# Patient Record
Sex: Male | Born: 1970 | Race: Black or African American | Hispanic: No | Marital: Single | Smoking: Never smoker
Health system: Southern US, Community
[De-identification: ages and names within clinical notes are randomized; demographics above are authoritative.]

## PROBLEM LIST (undated history)

## (undated) DIAGNOSIS — J302 Other seasonal allergic rhinitis: Secondary | ICD-10-CM

## (undated) DIAGNOSIS — J9819 Other pulmonary collapse: Secondary | ICD-10-CM

## (undated) HISTORY — PX: HAND SURGERY: SHX662

## (undated) HISTORY — PX: CHEST TUBE INSERTION: SHX231

## (undated) SURGERY — Surgical Case
Anesthesia: *Unknown

---

## 2015-03-02 ENCOUNTER — Emergency Department (HOSPITAL_BASED_OUTPATIENT_CLINIC_OR_DEPARTMENT_OTHER)
Admission: EM | Admit: 2015-03-02 | Discharge: 2015-03-03 | Disposition: A | Discharge status: Home / Self Care | Payer: BLUE CROSS/BLUE SHIELD | Attending: Emergency Medicine | Admitting: Emergency Medicine

## 2015-03-02 ENCOUNTER — Emergency Department (HOSPITAL_BASED_OUTPATIENT_CLINIC_OR_DEPARTMENT_OTHER): Payer: BLUE CROSS/BLUE SHIELD

## 2015-03-02 ENCOUNTER — Encounter (HOSPITAL_BASED_OUTPATIENT_CLINIC_OR_DEPARTMENT_OTHER): Payer: Self-pay | Admitting: *Deleted

## 2015-03-02 DIAGNOSIS — Z79899 Other long term (current) drug therapy: Secondary | ICD-10-CM | POA: Diagnosis not present

## 2015-03-02 DIAGNOSIS — S6992XA Unspecified injury of left wrist, hand and finger(s), initial encounter: Secondary | ICD-10-CM | POA: Diagnosis present

## 2015-03-02 DIAGNOSIS — X58XXXA Exposure to other specified factors, initial encounter: Secondary | ICD-10-CM | POA: Diagnosis not present

## 2015-03-02 DIAGNOSIS — Y999 Unspecified external cause status: Secondary | ICD-10-CM | POA: Insufficient documentation

## 2015-03-02 DIAGNOSIS — S60222A Contusion of left hand, initial encounter: Secondary | ICD-10-CM | POA: Insufficient documentation

## 2015-03-02 DIAGNOSIS — Y929 Unspecified place or not applicable: Secondary | ICD-10-CM | POA: Insufficient documentation

## 2015-03-02 DIAGNOSIS — Y939 Activity, unspecified: Secondary | ICD-10-CM | POA: Insufficient documentation

## 2015-03-02 NOTE — ED Notes (Signed)
Pt c/o left hand injury x 6 days ago

## 2015-03-03 MED ORDER — IBUPROFEN 800 MG PO TABS
800.0000 mg | ORAL_TABLET | Freq: Once | ORAL | Status: AC
  Administered 2015-03-03: 800 mg via ORAL
  Filled 2015-03-03: qty 1

## 2015-03-03 MED ORDER — ACETAMINOPHEN 325 MG PO TABS
650.0000 mg | ORAL_TABLET | Freq: Once | ORAL | Status: AC
  Administered 2015-03-03: 650 mg via ORAL
  Filled 2015-03-03: qty 2

## 2015-03-03 MED ORDER — IBUPROFEN 800 MG PO TABS
800.0000 mg | ORAL_TABLET | Freq: Three times a day (TID) | ORAL | Status: DC
Start: 2015-03-03 — End: 2019-05-14

## 2015-03-03 NOTE — ED Notes (Signed)
PA at the bedside.

## 2015-03-03 NOTE — ED Provider Notes (Signed)
CSN: [id number]     Arrival date & time 03/02/15  2145 History   First MD Initiated Contact with Patient 03/02/15 2317     Chief Complaint  Patient presents with  . Hand Injury     (Consider location/radiation/quality/duration/timing/severity/associated sxs/prior Treatment) Patient is a 44 y.o. male presenting with hand injury. The history is provided by the patient.  Hand Injury Location:  Hand Time since incident:  6 days Injury: yes   Mechanism of injury comment:  Hand mashed in a door Hand location:  L hand Pain details:    Quality:  Aching   Radiates to:  Does not radiate   Severity:  Moderate   Onset quality:  Sudden   Duration:  6 days   Timing:  Intermittent   Progression:  Worsening Chronicity:  New Handedness:  Right-handed Dislocation: no   Relieved by:  Nothing Worsened by:  Movement Ineffective treatments:  Ice Associated symptoms: decreased range of motion   Associated symptoms: no back pain, no neck pain and no numbness   Risk factors: no frequent fractures     History reviewed. No pertinent past medical history. History reviewed. No pertinent past surgical history. History reviewed. No pertinent family history. History  Substance Use Topics  . Smoking status: Never Smoker   . Smokeless tobacco: Not on file  . Alcohol Use: No    Review of Systems  Constitutional: Negative for activity change.       All ROS Neg except as noted in HPI  HENT: Negative for nosebleeds.   Eyes: Negative for photophobia and discharge.  Respiratory: Negative for cough, shortness of breath and wheezing.   Cardiovascular: Negative for chest pain and palpitations.  Gastrointestinal: Negative for abdominal pain and blood in stool.  Genitourinary: Negative for dysuria, frequency and hematuria.  Musculoskeletal: Negative for back pain, arthralgias and neck pain.  Skin: Negative.   Neurological: Negative for dizziness, seizures and speech difficulty.  Psychiatric/Behavioral:  Negative for hallucinations and confusion.      Allergies  Tramadol  Home Medications   Prior to Admission medications   Medication Sig Start Date End Date Taking? Authorizing Provider  montelukast (SINGULAIR) 10 MG tablet Take 10 mg by mouth at bedtime.   Yes Historical Provider, MD   BP 134/84 mmHg  Pulse 83  Temp(Src) 98.1 F (36.7 C)  Resp 18  Ht 6' 5.25" (1.962 m)  Wt 175 lb (79.379 kg)  BMI 20.62 kg/m2  SpO2 97% Physical Exam  Musculoskeletal:       Left shoulder: Normal.       Left elbow: Normal.       Left hand: He exhibits decreased range of motion and tenderness. He exhibits normal capillary refill and no deformity. Normal sensation noted.       Hands:   ED Course  Procedures (including critical care time) Labs Review Labs Reviewed - No data to display  Imaging Review Dg Hand Complete Left  03/02/2015   CLINICAL DATA:  Hand was slammed in a car door 1 week ago. Pain over the second through fourth knuckles.  EXAM: LEFT HAND - COMPLETE 3+ VIEW  COMPARISON:  None.  FINDINGS: There is no evidence of fracture or dislocation. There is no evidence of arthropathy or other focal bone abnormality. Soft tissues are unremarkable.  IMPRESSION: Negative.   Electronically Signed   By: Burman Nieves M.D.   On: 03/02/2015 22:09     EKG Interpretation None      MDM  Your  vital signs are well within normal limits. The x-ray of the left hand is negative for fracture or dislocation. The plan at this time will be ice, Ace bandage, ibuprofen and Tylenol. The patient states he [facility]. He is asked to see them for additional follow-up and management. The patient is in agreement with this discharge plan.    Final diagnoses:  None    *I have reviewed nursing notes, vital signs, and all appropriate lab and imaging results for this patient.**  I'll or  Ivery QualeHobson Kweku Stankey, Cordelia Poche-C 03/03/15 [id number]  April Palumbo,  MD 03/03/15 [id number]

## 2015-03-03 NOTE — Discharge Instructions (Signed)
Your x-ray is negative for fracture or dislocation. Please see your orthopedic specialist at [facility], for additional evaluation of your hand pain. Please use the Ace bandage for the next 3 days. Please use 800 mg of ibuprofen, and 500 mg of Tylenol 3 times daily over the next 5 days. Hand Contusion  A hand contusion is a deep bruise to the hand. Contusions happen when an injury causes bleeding under the skin. Signs of bruising include pain, puffiness (swelling), and discolored skin. The contusion may turn blue, purple, or yellow. HOME CARE  Put ice on the injured area.  Put ice in a plastic bag.  Place a towel between your skin and the bag.  Leave the ice on for 15-20 minutes, 03-04 times a day.  Only take medicines as told by your doctor.  Use an elastic wrap only as told. You may remove the wrap for sleeping, showering, and bathing. Take the wrap off if you lose feeling (have numbness) in your fingers, or they turn blue or cold. Put the wrap on more loosely.  Keep the hand raised (elevated) with pillows.  Avoid using your hand too much if it is painful. GET HELP RIGHT AWAY IF:   You have more redness, puffiness, or pain in your hand.  Your puffiness or pain does not get better with medicine.  You lose feeling in your hand, or you cannot move your fingers.  Your hand turns cold or blue.  You have pain when you move your fingers.  Your hand feels warm.  Your contusion does not get better in 2 days. MAKE SURE YOU:   Understand these instructions.  Will watch this condition.  Will get help right away if you are not doing well or you get worse. Document Released: 01/25/2008 Document Revised: 12/23/2013 Document Reviewed: 01/30/2012 [facility] Patient Information 2015 [employer] [place] [place]. This information is not intended to replace advice given to you by your health care provider. Make sure you discuss any questions you have with your health care  provider.

## 2019-05-14 ENCOUNTER — Emergency Department (HOSPITAL_BASED_OUTPATIENT_CLINIC_OR_DEPARTMENT_OTHER): Payer: Managed Care, Other (non HMO)

## 2019-05-14 ENCOUNTER — Encounter (HOSPITAL_COMMUNITY)
Admission: EM | Disposition: A | Discharge status: Home / Self Care | Payer: Self-pay | Source: Home / Self Care | Attending: Cardiothoracic Surgery

## 2019-05-14 ENCOUNTER — Other Ambulatory Visit: Payer: Self-pay

## 2019-05-14 ENCOUNTER — Encounter (HOSPITAL_BASED_OUTPATIENT_CLINIC_OR_DEPARTMENT_OTHER): Payer: Self-pay | Admitting: Emergency Medicine

## 2019-05-14 ENCOUNTER — Encounter (HOSPITAL_COMMUNITY): Payer: Self-pay | Admitting: Certified Registered Nurse Anesthetist

## 2019-05-14 ENCOUNTER — Inpatient Hospital Stay (HOSPITAL_BASED_OUTPATIENT_CLINIC_OR_DEPARTMENT_OTHER)
Admission: EM | Admit: 2019-05-14 | Discharge: 2019-05-18 | DRG: 200 | Disposition: A | Discharge status: Home / Self Care | Payer: Managed Care, Other (non HMO) | Attending: Cardiothoracic Surgery | Admitting: Cardiothoracic Surgery

## 2019-05-14 ENCOUNTER — Inpatient Hospital Stay: Admit: 2019-05-14 | Payer: BLUE CROSS/BLUE SHIELD | Admitting: Cardiothoracic Surgery

## 2019-05-14 ENCOUNTER — Inpatient Hospital Stay (HOSPITAL_COMMUNITY): Payer: Managed Care, Other (non HMO)

## 2019-05-14 DIAGNOSIS — F17210 Nicotine dependence, cigarettes, uncomplicated: Secondary | ICD-10-CM | POA: Diagnosis present

## 2019-05-14 DIAGNOSIS — Z9889 Other specified postprocedural states: Secondary | ICD-10-CM

## 2019-05-14 DIAGNOSIS — Z09 Encounter for follow-up examination after completed treatment for conditions other than malignant neoplasm: Secondary | ICD-10-CM

## 2019-05-14 DIAGNOSIS — Z885 Allergy status to narcotic agent status: Secondary | ICD-10-CM

## 2019-05-14 DIAGNOSIS — J849 Interstitial pulmonary disease, unspecified: Secondary | ICD-10-CM | POA: Diagnosis present

## 2019-05-14 DIAGNOSIS — J302 Other seasonal allergic rhinitis: Secondary | ICD-10-CM | POA: Diagnosis present

## 2019-05-14 DIAGNOSIS — R918 Other nonspecific abnormal finding of lung field: Secondary | ICD-10-CM | POA: Diagnosis not present

## 2019-05-14 DIAGNOSIS — J939 Pneumothorax, unspecified: Secondary | ICD-10-CM | POA: Diagnosis present

## 2019-05-14 DIAGNOSIS — Z9689 Presence of other specified functional implants: Secondary | ICD-10-CM

## 2019-05-14 DIAGNOSIS — Z79899 Other long term (current) drug therapy: Secondary | ICD-10-CM | POA: Diagnosis not present

## 2019-05-14 DIAGNOSIS — J930 Spontaneous tension pneumothorax: Principal | ICD-10-CM | POA: Diagnosis present

## 2019-05-14 DIAGNOSIS — F1210 Cannabis abuse, uncomplicated: Secondary | ICD-10-CM | POA: Diagnosis present

## 2019-05-14 DIAGNOSIS — J9383 Other pneumothorax: Secondary | ICD-10-CM | POA: Diagnosis not present

## 2019-05-14 DIAGNOSIS — J984 Other disorders of lung: Secondary | ICD-10-CM | POA: Diagnosis not present

## 2019-05-14 DIAGNOSIS — J9311 Primary spontaneous pneumothorax: Secondary | ICD-10-CM | POA: Diagnosis not present

## 2019-05-14 DIAGNOSIS — Z20828 Contact with and (suspected) exposure to other viral communicable diseases: Secondary | ICD-10-CM | POA: Diagnosis present

## 2019-05-14 DIAGNOSIS — J189 Pneumonia, unspecified organism: Secondary | ICD-10-CM | POA: Diagnosis not present

## 2019-05-14 HISTORY — DX: Other seasonal allergic rhinitis: J30.2

## 2019-05-14 HISTORY — DX: Other pulmonary collapse: J98.19

## 2019-05-14 LAB — COMPREHENSIVE METABOLIC PANEL
ALT: 43 U/L (ref 0–44)
AST: 65 U/L — ABNORMAL HIGH (ref 15–41)
Albumin: 3.3 g/dL — ABNORMAL LOW (ref 3.5–5.0)
Alkaline Phosphatase: 77 U/L (ref 38–126)
Anion gap: 13 (ref 5–15)
BUN: 7 mg/dL (ref 6–20)
CO2: 25 mmol/L (ref 22–32)
Calcium: 9 mg/dL (ref 8.9–10.3)
Chloride: 96 mmol/L — ABNORMAL LOW (ref 98–111)
Creatinine, Ser: 0.89 mg/dL (ref 0.61–1.24)
GFR calc Af Amer: >60 mL/min (ref >60)
GFR calc non Af Amer: >60 mL/min (ref >60)
Glucose, Bld: 119 mg/dL — ABNORMAL HIGH (ref 70–99)
Potassium: 4.1 mmol/L (ref 3.5–5.1)
Sodium: 134 mmol/L — ABNORMAL LOW (ref 135–145)
Total Bilirubin: 1 mg/dL (ref 0.3–1.2)
Total Protein: 8.3 g/dL — ABNORMAL HIGH (ref 6.5–8.1)

## 2019-05-14 LAB — CBC WITH DIFFERENTIAL/PLATELET
Abs Immature Granulocytes: 0.12 10*3/uL — ABNORMAL HIGH (ref 0.00–0.07)
Basophils Absolute: 0.1 10*3/uL (ref 0.0–0.1)
Basophils Relative: 0 %
Eosinophils Absolute: 0.1 10*3/uL (ref 0.0–0.5)
Eosinophils Relative: 0 %
HCT: 41.7 % (ref 39.0–52.0)
Hemoglobin: 14 g/dL (ref 13.0–17.0)
Immature Granulocytes: 1 %
Lymphocytes Relative: 11 %
Lymphs Abs: 1.9 10*3/uL (ref 0.7–4.0)
MCH: 31 pg (ref 26.0–34.0)
MCHC: 33.6 g/dL (ref 30.0–36.0)
MCV: 92.3 fL (ref 80.0–100.0)
Monocytes Absolute: 2 10*3/uL — ABNORMAL HIGH (ref 0.1–1.0)
Monocytes Relative: 11 %
Neutro Abs: 13.8 10*3/uL — ABNORMAL HIGH (ref 1.7–7.7)
Neutrophils Relative %: 77 %
Platelets: 333 10*3/uL (ref 150–400)
RBC: 4.52 MIL/uL (ref 4.22–5.81)
RDW: 12.3 % (ref 11.5–15.5)
WBC: 18 10*3/uL — ABNORMAL HIGH (ref 4.0–10.5)
nRBC: 0 % (ref 0.0–0.2)

## 2019-05-14 LAB — LACTIC ACID, PLASMA
Lactic Acid, Venous: 1.8 mmol/L (ref 0.5–1.9)
Lactic Acid, Venous: 1.3 mmol/L (ref 0.5–1.9)

## 2019-05-14 LAB — SARS CORONAVIRUS 2 BY RT PCR (HOSPITAL ORDER, PERFORMED IN ~~LOC~~ HOSPITAL LAB): SARS Coronavirus 2: NEGATIVE

## 2019-05-14 SURGERY — CHEST TUBE INSERTION
Anesthesia: Monitor Anesthesia Care | Laterality: Right

## 2019-05-14 MED ORDER — OXYCODONE HCL 5 MG PO TABS
5.0000 mg | ORAL_TABLET | ORAL | Status: DC | PRN
  Administered 2019-05-14 – 2019-05-18 (×13): 5 mg via ORAL
  Filled 2019-05-14 (×13): qty 1

## 2019-05-14 MED ORDER — FENTANYL CITRATE (PF) 250 MCG/5ML IJ SOLN
INTRAMUSCULAR | Status: AC
  Filled 2019-05-14: qty 5

## 2019-05-14 MED ORDER — MONTELUKAST SODIUM 10 MG PO TABS
10.0000 mg | ORAL_TABLET | Freq: Every day | ORAL | Status: DC
  Administered 2019-05-14 – 2019-05-17 (×4): 10 mg via ORAL
  Filled 2019-05-14 (×4): qty 1

## 2019-05-14 MED ORDER — ONDANSETRON HCL 4 MG PO TABS: 4.0000 mg | ORAL_TABLET | Freq: Four times a day (QID) | ORAL | Status: DC | PRN

## 2019-05-14 MED ORDER — DOCUSATE SODIUM 100 MG PO CAPS: 100.0000 mg | ORAL_CAPSULE | Freq: Two times a day (BID) | ORAL | Status: DC | PRN

## 2019-05-14 MED ORDER — MIDAZOLAM HCL 2 MG/2ML IJ SOLN
INTRAMUSCULAR | Status: AC
  Filled 2019-05-14: qty 2

## 2019-05-14 MED ORDER — FENTANYL CITRATE (PF) 100 MCG/2ML IJ SOLN
50.0000 ug | Freq: Once | INTRAMUSCULAR | Status: AC
  Administered 2019-05-14: 17:00:00 50 ug via INTRAVENOUS
  Filled 2019-05-14: qty 2

## 2019-05-14 MED ORDER — ONDANSETRON HCL 4 MG/2ML IJ SOLN
4.0000 mg | Freq: Once | INTRAMUSCULAR | Status: AC
  Administered 2019-05-14: 17:00:00 4 mg via INTRAVENOUS
  Filled 2019-05-14: qty 2

## 2019-05-14 MED ORDER — METOPROLOL TARTRATE 5 MG/5ML IV SOLN: 2.5000 mg | INTRAVENOUS | Status: DC | PRN

## 2019-05-14 MED ORDER — PROPOFOL 10 MG/ML IV BOLUS
INTRAVENOUS | Status: AC
  Filled 2019-05-14: qty 20

## 2019-05-14 MED ORDER — SODIUM CHLORIDE 0.9% FLUSH
3.0000 mL | Freq: Two times a day (BID) | INTRAVENOUS | Status: DC
  Administered 2019-05-14 – 2019-05-17 (×6): 3 mL via INTRAVENOUS

## 2019-05-14 MED ORDER — ONDANSETRON HCL 4 MG/2ML IJ SOLN: 4.0000 mg | Freq: Four times a day (QID) | INTRAMUSCULAR | Status: DC | PRN

## 2019-05-14 MED ORDER — ACETAMINOPHEN 325 MG PO TABS: 650.0000 mg | ORAL_TABLET | Freq: Four times a day (QID) | ORAL | Status: DC | PRN

## 2019-05-14 MED ORDER — ENOXAPARIN SODIUM 40 MG/0.4ML ~~LOC~~ SOLN
40.0000 mg | SUBCUTANEOUS | Status: DC
  Administered 2019-05-15 – 2019-05-18 (×4): 40 mg via SUBCUTANEOUS
  Filled 2019-05-14 (×5): qty 0.4

## 2019-05-14 NOTE — ED Triage Notes (Signed)
SOB, cough, diarrhea, minimal vomiting, inability to eat, fatigue x3 days.

## 2019-05-14 NOTE — ED Notes (Signed)
ED Provider at bedside. 

## 2019-05-14 NOTE — ED Notes (Signed)
Meals given for lunch.  Tolerated it well.

## 2019-05-14 NOTE — H&P (Addendum)
[address]       [place] [address]             [phone]        Blake Benson [facility] Medical Record #[id number] Date of Birth: Sep 05, 1970  Referring: [facility] ED Primary Care: Darryl Lent, PA-C  Chief Complaint:    Chief Complaint  Patient presents with  . Shortness of Breath    History of Present Illness:      Mr. Fingerhut is a 48 yo AA male transferred to [facility] emergency department for right pneumothorax.  The patient presented to [facility] ED with 3 day complaint of feeling ill with chest pain, shortness of breath, nausea, vomiting, diarrhea, and night sweats.  Workup in the ED was negative for COVID-19.  CXR was obtained and showed a right sided pneumothorax with mediastinal shift.  He was transferred to [facility] for intervention.  Upon arrival to the ED, the physician reviewed his chest xray and placed a right sided pigtail chest tube.  The patient tolerated the procedure without difficulty.  Currently the patient is sitting up in bed without complaints.  He states today was the first day he was able to eat any food.  He admits to smoking cigarettes which he states his usage has decreased, but he also smokes marijuana daily.  He drinks ETOH mainly on the weekends.  He has a previous history of pneumothorax requiring chest tube placement performed at [facility] in the past.  He has had 1 previous surgery on his left wrist and otherwise denies medical problems.  Current Activity/ Functional Status: Patient is independent with mobility/ambulation, transfers, ADL's, IADL's.  Past Medical History:  Diagnosis Date  . Collapsed lung   . Seasonal allergies     Past Surgical History:  Procedure Laterality Date  . CHEST TUBE INSERTION    . HAND SURGERY      Social History   Tobacco Use  Smoking Status Never Smoker  Smokeless Tobacco Never Used    Social History   Substance and Sexual Activity  Alcohol Use No      Allergies  Allergen Reactions  . Tramadol     Current Facility-Administered Medications  Medication Dose Route Frequency Provider Last Rate Last Dose  . acetaminophen (TYLENOL) tablet 650 mg  650 mg Oral Q6H PRN Barrett, Erin R, PA-C      . docusate sodium (COLACE) capsule 100 mg  100 mg Oral BID PRN Barrett, Erin R, PA-C      . [START ON 05/15/2019] enoxaparin (LOVENOX) injection 40 mg  40 mg Subcutaneous Q24H Barrett, Erin R, PA-C      . metoprolol tartrate (LOPRESSOR) injection 2.5-5 mg  2.5-5 mg Intravenous Q5 min PRN Barrett, Erin R, PA-C      . montelukast (SINGULAIR) tablet 10 mg  10 mg Oral QHS Barrett, Erin R, PA-C      . ondansetron (ZOFRAN) tablet 4 mg  4 mg Oral Q6H PRN Barrett, Erin R, PA-C       Or  . ondansetron (ZOFRAN) injection 4 mg  4 mg Intravenous Q6H PRN Barrett, Erin R, PA-C      . oxyCODONE (Oxy IR/ROXICODONE) immediate release tablet 5 mg  5 mg Oral Q4H PRN Barrett, Erin R, PA-C      . sodium chloride flush (NS) 0.9 % injection 3 mL  3 mL Intravenous Q12H Barrett, Erin R, PA-C       Current  Outpatient Medications  Medication Sig Dispense Refill  . ibuprofen (ADVIL,MOTRIN) 800 MG tablet Take 1 tablet (800 mg total) by mouth 3 (three) times daily. 21 tablet 0  . montelukast (SINGULAIR) 10 MG tablet Take 10 mg by mouth at bedtime.     Review of Systems:   Review of Systems  Neurological: Sensory change:     Constitutional: positive for night sweats Respiratory: positive for cough and dyspnea on exertion Cardiovascular: positive for chest pain Gastrointestinal: positive for diarrhea, nausea and vomiting Neurological: negative     Cardiac Review of Systems: Y or  [    ]= no  Chest Pain [ Y, resolved   ]  Resting SOB [   ] Exertional SOB  [ Y]  Orthopnea [  ]   Pedal Edema [   ]    Palpitations [  ] Syncope  [  ]   Presyncope [   ]  General Review of Systems: [Y] = yes [  ]=no Constitional: recent weight change [  ]; anorexia [  ]; fatigue [  ]; nausea [Y,  improved  ]; night sweats [Y  ]; fever [ N ]; or chills [  ]                                                               Dental: Last Dentist visit:   Eye : blurred vision [  ]; diplopia [   ]; vision changes [  ];  Amaurosis fugax[  ]; Resp: cough [ Y ];  wheezing[  ];  hemoptysis[N  ]; shortness of breath[  ]; paroxysmal nocturnal dyspnea[  ]; dyspnea on exertion[  ]; or orthopnea[  ];  GI:  gallstones[  ], vomiting[Y, resolved  ];  dysphagia[  ]; melena[  ];  hematochezia [  ]; heartburn[  ];   Hx of  Colonoscopy[  ]; GU: kidney stones [  ]; hematuria[  ];   dysuria [  ];  nocturia[  ];  history of     obstruction [  ]; urinary frequency [  ]             Skin: rash, swelling[N  ];, hair loss[  ];  peripheral edema[  ];  or itching[  ]; Musculosketetal: myalgias[  ];  joint swelling[  ];  joint erythema[  ];  joint pain[  ];  back pain[  ];  Heme/Lymph: bruising[  ];  bleeding[  ];  anemia[  ];  Neuro: TIA[  ];  headaches[  ];  stroke[  ];  vertigo[  ];  seizures[  ];   paresthesias[  ];  difficulty walking[  ];  Psych:depression[  ]; anxiety[  ];  Endocrine: diabetes[  ];  thyroid dysfunction[  ];  Physical Exam: BP (!) 145/93   Pulse 87   Temp 98.8 F (37.1 C) (Oral)   Resp (!) 38   Ht 6\' 5"  (1.956 m)   Wt 77.2 kg   SpO2 92%   BMI 20.18 kg/m    General appearance: alert, cooperative and no distress Head: Normocephalic, without obvious abnormality, atraumatic Resp: clear to auscultation bilaterally Cardio: regular rate and rhythm GI: soft, non-tender; bowel sounds normal; no masses,  no organomegaly Extremities: extremities normal, atraumatic, no cyanosis or  edema Neurologic: Grossly normal  Diagnostic Studies & Laboratory data:     Recent Radiology Findings:   Dg Chest Port 1 View  Result Date: 05/14/2019 CLINICAL DATA:  Shortness of breath EXAM: PORTABLE CHEST 1 VIEW COMPARISON:  None. FINDINGS: There is a moderate to large right-sided pneumothorax with leftward  mediastinal shift. There is hazy airspace opacity seen throughout the left lung. No acute osseous findings. IMPRESSION: Moderate to large right-sided pneumothorax. These results were called by telephone at the time of interpretation on 05/14/2019 at 9:34 am to provider [facility] , who verbally acknowledged these results. Electronically Signed   By: Prudencio Pair M.D.   On: 05/14/2019 09:39     I have independently reviewed the above radiologic studies and discussed with the patient   Recent Lab Findings: Lab Results  Component Value Date   WBC 18.0 (H) 05/14/2019   HGB 14.0 05/14/2019   HCT 41.7 05/14/2019   PLT 333 05/14/2019   GLUCOSE 119 (H) 05/14/2019   ALT 43 05/14/2019   AST 65 (H) 05/14/2019   NA 134 (L) 05/14/2019   K 4.1 05/14/2019   CL 96 (L) 05/14/2019   CREATININE 0.89 05/14/2019   BUN 7 05/14/2019   CO2 25 05/14/2019    Assessment / Plan:      1. Right sided pneumothorax with mediastinal shift- ED physician placed pigtail catheter- no air leak in pleurovac, leave chest tube on suction.. follow up CXR remains pending 2. Nicotine abuse 3. Marijuana abuse 4. Dispo- admit patient to progressive care unit, being this is recurrent pneumothorax will get CT chest in AM.. if pneumothorax doesn't completely resolve may require VATS procedure in future  I  spent 55 minutes counseling the patient face to face.   Ellwood Handler, PA-C  05/14/2019 5:32 PM Patient examined, chest x-rays both before and after right pigtail catheter placement personally reviewed. Patient discussed with Ms. Ahmed Prima PA-C for coordination of care. 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  Chest x-ray at the urgent care center showed a significant right spontaneous pneumothorax.  A pleural pigtail catheter placed in the ED shows good reexpansion of the right lung.  The patient is currently stable.  He has  history of a previous right sided spontaneous pneumothorax treated with chest tube at [facility] 2 years ago.  Will evaluate him with a CT scan of the chest with IV contrast to assess his risk of further recurrent spontaneous right pneumothorax.  If his risk is felt to be significant then he may need a VATS procedure later this admission. Dahlia Byes MD

## 2019-05-14 NOTE — ED Provider Notes (Signed)
[facility] EMERGENCY DEPARTMENT Provider Note   CSN: [id number] Arrival date & time: 05/14/19  5638     History   Chief Complaint Chief Complaint  Patient presents with  . Shortness of Breath    HPI Blake Benson is a 48 y.o. male.  He is complaining of feeling sick since Friday 3 days ago.  He is complaining of shortness of breath with ambulation, cough nonproductive, chest pain in the setting of coughing, nausea and diarrhea with decreased appetite.  No known fever but has woken up at night sweaty.  No sick contacts or recent travel.  No known Covid exposures.  Complaining of fatigue but no headache or body aches.     The history is provided by the patient.  Shortness of Breath Severity:  Moderate Onset quality:  Gradual Timing:  Intermittent Progression:  Unchanged Chronicity:  New Context: activity   Relieved by:  Rest Worsened by:  Activity Ineffective treatments:  None tried Associated symptoms: chest pain and cough   Associated symptoms: no abdominal pain, no fever, no headaches, no hemoptysis, no neck pain, no rash, no sore throat, no sputum production, no syncope, no vomiting and no wheezing   Risk factors: no tobacco use     Past Medical History:  Diagnosis Date  . Collapsed lung   . Seasonal allergies     There are no active problems to display for this patient.   Past Surgical History:  Procedure Laterality Date  . CHEST TUBE INSERTION    . HAND SURGERY          Home Medications    Prior to Admission medications   Medication Sig Start Date End Date Taking? Authorizing Provider  ibuprofen (ADVIL,MOTRIN) 800 MG tablet Take 1 tablet (800 mg total) by mouth 3 (three) times daily. 03/03/15   Lily Kocher, PA-C  montelukast (SINGULAIR) 10 MG tablet Take 10 mg by mouth at bedtime.    [provider]    Family History No family history on file.  Social History Social History   Tobacco Use  . Smoking status: Never Smoker  .  Smokeless tobacco: Never Used  Substance Use Topics  . Alcohol use: No  . Drug use: Not Currently     Allergies   Tramadol   Review of Systems Review of Systems  Constitutional: Positive for appetite change and fatigue. Negative for fever.  HENT: Negative for sore throat.   Eyes: Negative for visual disturbance.  Respiratory: Positive for cough and shortness of breath. Negative for hemoptysis, sputum production and wheezing.   Cardiovascular: Positive for chest pain. Negative for syncope.  Gastrointestinal: Positive for diarrhea and nausea. Negative for abdominal pain and vomiting.  Genitourinary: Negative for dysuria.  Musculoskeletal: Negative for myalgias and neck pain.  Skin: Negative for rash.  Neurological: Negative for headaches.     Physical Exam Updated Vital Signs BP (!) 140/98 (BP Location: Right Arm)   Pulse 94   Temp 98.8 F (37.1 C) (Oral)   Resp (!) 32   Ht 6\' 5"  (1.956 m)   Wt 77.2 kg   SpO2 92%   BMI 20.18 kg/m   Physical Exam Vitals signs and nursing note reviewed.  Constitutional:      Appearance: He is well-developed.  HENT:     Head: Normocephalic and atraumatic.  Eyes:     Conjunctiva/sclera: Conjunctivae normal.  Neck:     Musculoskeletal: Neck supple.  Cardiovascular:     Rate and Rhythm: Normal rate and  regular rhythm.     Heart sounds: No murmur.  Pulmonary:     Effort: Pulmonary effort is normal. Tachypnea present. No respiratory distress.     Breath sounds: Examination of the right-upper field reveals rhonchi. Examination of the left-upper field reveals rhonchi. Examination of the right-lower field reveals decreased breath sounds. Decreased breath sounds and rhonchi present.  Abdominal:     Palpations: Abdomen is soft.     Tenderness: There is no abdominal tenderness.  Musculoskeletal: Normal range of motion.        General: No tenderness.     Right lower leg: He exhibits no tenderness. No edema.     Left lower leg: He exhibits  no tenderness. No edema.  Skin:    General: Skin is warm and dry.     Capillary Refill: Capillary refill takes less than 2 seconds.  Neurological:     General: No focal deficit present.     Mental Status: He is alert and oriented to person, place, and time.      ED Treatments / Results  Labs (all labs ordered are listed, but only abnormal results are displayed) Labs Reviewed  COMPREHENSIVE METABOLIC PANEL - Abnormal; Notable for the following components:      Result Value   Sodium 134 (*)    Chloride 96 (*)    Glucose, Bld 119 (*)    Total Protein 8.3 (*)    Albumin 3.3 (*)    AST 65 (*)    All other components within normal limits  CBC WITH DIFFERENTIAL/PLATELET - Abnormal; Notable for the following components:   WBC 18.0 (*)    Neutro Abs 13.8 (*)    Monocytes Absolute 2.0 (*)    Abs Immature Granulocytes 0.12 (*)    All other components within normal limits  SARS CORONAVIRUS 2 (HOSPITAL ORDER, PERFORMED IN [facility])  LACTIC ACID, PLASMA  LACTIC ACID, PLASMA    EKG EKG Interpretation  Date/Time:  Tuesday May 14 2019 09:16:17 EDT Ventricular Rate:  89 PR Interval:    QRS Duration: 144 QT Interval:  343 QTC Calculation: 418 R Axis:   96 Text Interpretation:  Sinus rhythm Atrial premature complexes Borderline short PR interval Nonspecific intraventricular conduction delay nonspecific st/ts No old tracing to compare Confirmed by Meridee ScoreButler, Mariamawit Depaoli [phone]([id number]) on 05/14/2019 9:23:25 AM   Radiology Dg Chest Port 1 View  Result Date: 05/14/2019 CLINICAL DATA:  Shortness of breath EXAM: PORTABLE CHEST 1 VIEW COMPARISON:  None. FINDINGS: There is a moderate to large right-sided pneumothorax with leftward mediastinal shift. There is hazy airspace opacity seen throughout the left lung. No acute osseous findings. IMPRESSION: Moderate to large right-sided pneumothorax. These results were called by telephone at the time of interpretation on 05/14/2019 at 9:34 am to  [facility] Max[facility] Beck[facility] , who verbally acknowledged these results. Electronically Signed   By: Jonna ClarkBindu  Avutu M.D.   On: 05/14/2019 09:39    Procedures .Critical Care Performed by: Terrilee FilesButler, Halea Lieb C, MD Authorized by: Terrilee FilesButler, Melvenia Favela C, MD   Critical care provider statement:    Critical care time (minutes):  45   Critical care time was exclusive of:  Separately billable procedures and treating other patients   Critical care was necessary to treat or prevent imminent or life-threatening deterioration of the following conditions:  Respiratory failure   Critical care was time spent personally by me on the following activities:  Discussions with consultants, evaluation of patient's response to treatment, examination of patient, ordering and  performing treatments and interventions, ordering and review of laboratory studies, ordering and review of radiographic studies, pulse oximetry, re-evaluation of patient's condition, obtaining history from patient or surrogate, review of old charts and development of treatment plan with patient or surrogate   I assumed direction of critical care for this patient from another provider in my specialty: no     (including critical care time)  Medications Ordered in ED Medications - No data to display   Initial Impression / Assessment and Plan / ED Course  I have reviewed the triage vital signs and the nursing notes.  Pertinent labs & imaging results that were available during my care of the patient were reviewed by me and considered in my medical decision making (see chart for details).  Clinical Course as of May 13 1618  Tue May 14, 2019  5747 48 year old male with prior history of pneumothorax here with fatigue decreased appetite cough chest pain shortness of breath and diarrhea.  Differential includes pneumonia, Covid, pneumothorax, ACS, metabolic derangement   [MB]  5374 Reviewed patient's chest x-ray.  He has a large right pneumothorax.  Put him in for  Covid testing.   [MB]  [phone] Discussed with CT surgery on-call PA.  She is going to reach out to Dr. Tyrone Sage for recommendations.   [MB]  0955 Chest tube set up at bedside if patient has any increased difficulties.   [MB]  [phone] Discussed with Alycia Rossetti from cardiothoracic surgery.  She said the patient can be admitted to Dr. Maren Beach service.  She said that they can place the chest tube at [facility], but if he needs it sooner that I should place it here.   [MB]  1002 I updated the patient.  He says he feels a short of breath is stable and unchanged since arrival.  His lungs he does not exert himself he says he feels okay.  We will hold off on chest tube for now.   [MB]  1039 Reevaluated patient.  He appears very comfortable and is in his room surfing the web on his phone.  No respiratory distress.   [MB]  1352 Evaluated patient.  He still appears comfortable. Denies worsening SOB. We have received no bed at [facility].  Awaiting bed so the patient can be transferred.   [MB]  1453 Received a call back from [place] from cardiothoracic.  She said Dr. Maren Beach would like the patient transferred to the ED at [facility] where he can evaluate him and put the chest tube in.   [MB]  1455 Discussed with Dr. Iantha Fallen from [facility] ED accepts patient in transfer.   [MB]    Clinical Course User Index [MB] Terrilee Files, MD   Amador Cunas was evaluated in Emergency Department on 05/14/2019 for the symptoms described in the history of present illness. He was evaluated in the context of the global COVID-19 pandemic, which necessitated consideration that the patient might be at risk for infection with the SARS-CoV-2 virus that causes COVID-19. Institutional protocols and algorithms that pertain to the evaluation of patients at risk for COVID-19 are in a state of rapid change based on information released by regulatory bodies including the CDC and federal and state organizations. These policies and algorithms were followed  during the patient's care in the ED.      Final Clinical Impressions(s) / ED Diagnoses   Final diagnoses:  Pneumothorax, right    ED Discharge Orders    None  Terrilee Files, MD 05/14/19 1620

## 2019-05-14 NOTE — ED Provider Notes (Signed)
5:20 PM BP (!) 145/93   Pulse 87   Temp 98.8 F (37.1 C) (Oral)   Resp (!) 38   Ht 6\' 5"  (1.956 m)   Wt 77.2 kg   SpO2 92%   BMI 20.18 kg/m  Patient here with spontaneous PTX sent over from [facility]. On my examination patient is sitting bolt upright with a respiratory rate in the 40s.  He appears very uncomfortable.  No breath sounds are present on the right and he appears to have some mild tracheal deviation toward the left.  Reviewed the patient's previous chest x-ray which showed mediastinal shift and moderate to large right-sided pneumothorax.  CHEST TUBE INSERTION  Date/Time: 05/14/2019 5:27 PM Performed by: Margarita Mail, PA-C Authorized by: Margarita Mail, PA-C   Consent:    Consent obtained:  Verbal   Consent given by:  Patient   Risks discussed:  Bleeding, damage to surrounding structures, infection, incomplete drainage and pain   Alternatives discussed:  Delayed treatment Pre-procedure details:    Skin preparation:  ChloraPrep   Preparation: Patient was prepped and draped in the usual sterile fashion   Procedure details:    Placement location:  R lateral   Scalpel size:  11   Tube size (Fr):  16 (16 cook pigtail catheter)   Tension pneumothorax: yes     Tube connected to:  Heimlich valve, suction and water seal   Drainage characteristics:  Air only   Suture material:  2-0 silk  Reevaluation of the patient after chest tube reveals equal bilateral breath sounds.  I spoke with Junie Panning, APP with [facility], She is placing admission orders.  Repeat chest x-ray shows reinflation of the lung with good position of the chest tube. Patient is to be admitted by [place] surgery   Margarita Mail, PA-C 05/14/19 2320    Veryl Speak, MD 05/18/19 [phone]

## 2019-05-14 NOTE — ED Notes (Signed)
Pt brought in by [facility]. Transferred from [facility], diagnosed with pneumothorax. C/o shortness of breath and pain in right chest

## 2019-05-14 NOTE — ED Notes (Signed)
[facility] called and said they will not accept the Pt. Due to his need for the chest tube.  [facility] wants the Pt. To go ED to ED.

## 2019-05-15 ENCOUNTER — Inpatient Hospital Stay (HOSPITAL_COMMUNITY): Payer: Managed Care, Other (non HMO)

## 2019-05-15 DIAGNOSIS — J939 Pneumothorax, unspecified: Secondary | ICD-10-CM

## 2019-05-15 DIAGNOSIS — J849 Interstitial pulmonary disease, unspecified: Secondary | ICD-10-CM

## 2019-05-15 LAB — CBC
HCT: 39.5 % (ref 39.0–52.0)
Hemoglobin: 13.8 g/dL (ref 13.0–17.0)
MCH: 31.7 pg (ref 26.0–34.0)
MCHC: 34.9 g/dL (ref 30.0–36.0)
MCV: 90.6 fL (ref 80.0–100.0)
Platelets: 332 10*3/uL (ref 150–400)
RBC: 4.36 MIL/uL (ref 4.22–5.81)
RDW: 12.1 % (ref 11.5–15.5)
WBC: 15.5 10*3/uL — ABNORMAL HIGH (ref 4.0–10.5)
nRBC: 0 % (ref 0.0–0.2)

## 2019-05-15 LAB — RESPIRATORY PANEL BY PCR

## 2019-05-15 LAB — BASIC METABOLIC PANEL
Anion gap: 13 (ref 5–15)
BUN: 7 mg/dL (ref 6–20)
CO2: 24 mmol/L (ref 22–32)
Calcium: 8.3 mg/dL — ABNORMAL LOW (ref 8.9–10.3)
Chloride: 95 mmol/L — ABNORMAL LOW (ref 98–111)
Creatinine, Ser: 0.84 mg/dL (ref 0.61–1.24)
GFR calc Af Amer: >60 mL/min (ref >60)
GFR calc non Af Amer: >60 mL/min (ref >60)
Glucose, Bld: 106 mg/dL — ABNORMAL HIGH (ref 70–99)
Potassium: 3.3 mmol/L — ABNORMAL LOW (ref 3.5–5.1)
Sodium: 132 mmol/L — ABNORMAL LOW (ref 135–145)

## 2019-05-15 LAB — STREP PNEUMONIAE URINARY ANTIGEN: Strep Pneumo Urinary Antigen: NEGATIVE

## 2019-05-15 LAB — SEDIMENTATION RATE: Sed Rate: 100 mm/hr — ABNORMAL HIGH (ref 0–16)

## 2019-05-15 MED ORDER — IOHEXOL 300 MG/ML  SOLN
100.0000 mL | Freq: Once | INTRAMUSCULAR | Status: AC
  Administered 2019-05-15: 09:00:00 75 mL via INTRAVENOUS

## 2019-05-15 MED ORDER — SODIUM CHLORIDE 0.9 % IV SOLN
500.0000 mg | INTRAVENOUS | Status: DC
  Administered 2019-05-15 – 2019-05-17 (×3): 500 mg via INTRAVENOUS
  Filled 2019-05-15 (×5): qty 500

## 2019-05-15 MED ORDER — SODIUM CHLORIDE 0.9 % IV SOLN
2.0000 g | INTRAVENOUS | Status: DC
  Administered 2019-05-15 – 2019-05-17 (×3): 2 g via INTRAVENOUS
  Filled 2019-05-15: qty 2
  Filled 2019-05-15: qty 20
  Filled 2019-05-15 (×2): qty 2
  Filled 2019-05-15: qty 20

## 2019-05-15 NOTE — Progress Notes (Addendum)
[address]       [place] [address]             [phone]      1 Day Post-Op Procedure(s) (LRB): CHEST TUBE INSERTION (Right) Subjective: Feels ok, not SOB  Objective: Vital signs in last 24 hours: Temp:  [98.4 F (36.9 C)-99.8 F (37.7 C)] 98.4 F (36.9 C) (09/23 0739) Pulse Rate:  [66-101] 69 (09/23 0739) Cardiac Rhythm: Normal sinus rhythm (09/22 1902) Resp:  [16-40] 16 (09/23 0739) BP: (123-154)/(80-121) 123/80 (09/23 0739) SpO2:  [90 %-100 %] 97 % (09/23 0739) Weight:  [74.6 kg-77.2 kg] 74.6 kg (09/23 0500)  Hemodynamic parameters for last 24 hours:    Intake/Output from previous day: 09/22 0701 - 09/23 0700 In: 200 [P.O.:200] Out: 385 [Urine:375; Chest Tube:10] Intake/Output this shift: No intake/output data recorded.  General appearance: alert, cooperative and no distress Heart: regular rate and rhythm Lungs: minor crackles in lower fields  Lab Results: Recent Labs    05/14/19 0910 05/15/19 0220  WBC 18.0* 15.5*  HGB 14.0 13.8  HCT 41.7 39.5  PLT 333 332   BMET:  Recent Labs    05/14/19 0910 05/15/19 0220  NA 134* 132*  K 4.1 3.3*  CL 96* 95*  CO2 25 24  GLUCOSE 119* 106*  BUN 7 7  CREATININE 0.89 0.84  CALCIUM 9.0 8.3*    PT/INR: No results for input(s): LABPROT, INR in the last 72 hours. ABG No results found for: PHART, HCO3, TCO2, ACIDBASEDEF, O2SAT CBG (last 3)  No results for input(s): GLUCAP in the last 72 hours.  Meds Scheduled Meds: . enoxaparin (LOVENOX) injection  40 mg Subcutaneous Q24H  . montelukast  10 mg Oral QHS  . sodium chloride flush  3 mL Intravenous Q12H   Continuous Infusions: PRN Meds:.acetaminophen, docusate sodium, metoprolol tartrate, ondansetron **OR** ondansetron (ZOFRAN) IV, oxyCODONE  Xrays Dg Chest Port 1 View  Result Date: 05/14/2019 CLINICAL DATA:  Post chest tube insertion EXAM: PORTABLE CHEST 1 VIEW COMPARISON:  05/14/2019 FINDINGS: Interval placement of pigtail  pleural catheter on the right with re-expansion of the right lung. Probable small residual right apical pneumothorax. Airspace disease throughout the left lung is stable. Heart is normal size. No effusions. No acute bony abnormality. IMPRESSION: Interval placement of right pleural catheter with re-expansion of the right lung. Small residual right apical pneumothorax. Diffuse left lung airspace disease, stable. Electronically Signed   By: Rolm Baptise M.D.   On: 05/14/2019 18:16   Dg Chest Port 1 View  Result Date: 05/14/2019 CLINICAL DATA:  Shortness of breath EXAM: PORTABLE CHEST 1 VIEW COMPARISON:  None. FINDINGS: There is a moderate to large right-sided pneumothorax with leftward mediastinal shift. There is hazy airspace opacity seen throughout the left lung. No acute osseous findings. IMPRESSION: Moderate to large right-sided pneumothorax. These results were called by telephone at the time of interpretation on 05/14/2019 at 9:34 am to provider [facility] , who verbally acknowledged these results. Electronically Signed   By: Prudencio Pair M.D.   On: 05/14/2019 09:39    Assessment/Plan:  1 doing well overall 2 hemodyn stable  In sinus rhythm 3 sats good on 2 liters, add flutter valve and IS 4 CXR - no pntx 5 CT no air leak, cont to suction for now   LOS: 1 day    [facility] 05/15/2019 Pager [phone]  patient examined and medical record reviewed,agree with above note.  I  personally reviewed the CT chest images performed today.  The spontaneous right pneumothorax has resolved.  There are no clear blebs that would benefit from resection.  Also the patient has severe parenchymal disease of the left lung which would significantly increase risk of VATS surgery.  Without previous x-rays to compare it is difficult to discern whether the left lung parenchymal injury pattern is acute or chronic.  Patient would benefit from pulmonary medicine evaluation.  Because of severe left lung  parenchymal consolidation we will treat the right lung spontaneous pneumothorax conservatively with a pigtail catheter which so far has been successful.  Kathlee Nations Trigt III 05/15/2019

## 2019-05-15 NOTE — Consult Note (Signed)
NAME:  Blake Benson, MRN:  [id number], DOB:  June 17, 1971, LOS: 1 ADMISSION DATE:  05/14/2019, CONSULTATION DATE:  9/23 REFERRING MD:  Alla German, CHIEF COMPLAINT:  Pulmonary infiltrates    Brief History   48 year old black male admitted 9/22 with spontaneous right pneumothorax (this is his second pneumothorax on the right).  Also had left greater than right airspace disease.  Pulmonary asked to evaluate due to abnormal CT chest findings  History of present illness   This a pleasant 48 year old black male who presented to the emergency room on 9/22 with chief complaint of 5-day history of cough, worsening shortness of breath, possible fever, some associated nausea and vomiting.  He stated the initial onset of symptoms were cough, this was first noted around 9/17 this progressed over the course of that day and was associated with worsening shortness of breath over the course of the day, and significant shortness of breath with worsening cough on the 18th.  He reports he was unable to walk 2 to 3 feet without feeling short of breath he had taken several over-the-counter medications however finally decided to come to the emergency room for evaluation on the 23rd.  He does smoke marijuana daily, he does not smoke cigarettes.  He does not vape.  The day prior to onset of illness he had shared a bowl with his brother-in-law.  He had no known sick exposures.  He has had no travel.  He works packing boxes, wears a mask at work daily, has no pets at home.  In the emergency room he was tested for COVID-19 this was negative.  A chest x-ray was obtained this showed bilateral predominantly left-sided airspace disease but also new spontaneous right pneumothorax.  He was admitted to the cardiothoracic team who placed a pigtail catheter on day of admission with associated dilution of the pneumothorax.  Although clinically he has felt better he continues to have a significantly abnormal CT chest notable for left-sided  predominant interstitial airspace disease with some right basilar airspace disease as well.  Pulmonary was asked to evaluate to comment on the parenchymal changes.  Past Medical History  Prior right spontaneous pneumothorax,, Seasonal allergies.  Prior tobacco abuse  Significant Hospital Events   9/22 admitted by thoracic surgery right chest tube placed  Consults:  Pulmonary consulted 9/2 3  Procedures:  Right pigtail catheter placement 9/22  Significant Diagnostic Tests:  CT chest: Right-sided chest tube in place.  Minimal residual pneumothorax.  Multifocal groundglass changes predominantly on the left side.  There is consolidative and groundglass changes in the right lower lobe the right upper lobe is largely spared  Micro Data:   COVID-19 9/22 negative  Urine strep antigen 9/23>>> Urine Legionella antigen 9/23>>> RVP 9/23>>>  Antimicrobials:  azith 9/23>>> Rocephin 9/23>>>  Interim history/subjective:  Feels better  Objective   Blood pressure 118/86, pulse 74, temperature 97.9 F (36.6 C), temperature source Oral, resp. rate (Abnormal) 21, height 6\' 5"  (1.956 m), weight 74.6 kg, SpO2 99 %.        Intake/Output Summary (Last 24 hours) at 05/15/2019 1354 Last data filed at 05/15/2019 1200 Gross per 24 hour  Intake 440 ml  Output 685 ml  Net -245 ml   Filed Weights   05/14/19 0846 05/14/19 1827 05/15/19 0500  Weight: 77.2 kg 77.1 kg 74.6 kg    Examination: General: Well-developed 48 year old male patient resting comfortably in bed he is in no acute distress HENT: Normocephalic atraumatic no jugular venous distention mucous membranes moist Lungs:  Scattered rhonchi predominantly on the right bilateral crackles both bases Cardiovascular: Regular rate and rhythm without murmur rub or gallop Abdomen: Soft not tender Extremities: No edema Neuro: Awake oriented GU: Voids spontaneously  Resolved Hospital Problem list     Assessment & Plan:   Spontaneous right  pneumothorax -This is his second pneumothorax, seemingly in the context of acute cough and possible viral versus bacterial illness Plan Chest tube management per thoracic surgery As this is his second spontaneous pneumothorax consideration should be made for pleurodesis  Left greater than right pulmonary infiltrates of unclear etiology differential diagnosis includes: Community-acquired pneumonia either bacterial or viral, pneumonitis, possibly inhalation related, less likely pulmonary edema or interstitial lung disease.  Seems acute in onset Plan Checking respiratory viral panel Send urine strep and Legionella antigen Start empiric CAP coverage Check sed rate    Labs   CBC: Recent Labs  Lab 05/14/19 0910 05/15/19 0220  WBC 18.0* 15.5*  NEUTROABS 13.8*  --   HGB 14.0 13.8  HCT 41.7 39.5  MCV 92.3 90.6  PLT 333 631    Basic Metabolic Panel: Recent Labs  Lab 05/14/19 0910 05/15/19 0220  NA 134* 132*  K 4.1 3.3*  CL 96* 95*  CO2 25 24  GLUCOSE 119* 106*  BUN 7 7  CREATININE 0.89 0.84  CALCIUM 9.0 8.3*   GFR: Estimated Creatinine Clearance: 112.2 mL/min (by C-G formula based on SCr of 0.84 mg/dL). Recent Labs  Lab 05/14/19 0910 05/14/19 1115 05/15/19 0220  WBC 18.0*  --  15.5*  LATICACIDVEN 1.8 1.3  --     Liver Function Tests: Recent Labs  Lab 05/14/19 0910  AST 65*  ALT 43  ALKPHOS 77  BILITOT 1.0  PROT 8.3*  ALBUMIN 3.3*   No results for input(s): LIPASE, AMYLASE in the last 168 hours. No results for input(s): AMMONIA in the last 168 hours.  ABG No results found for: PHART, PCO2ART, PO2ART, HCO3, TCO2, ACIDBASEDEF, O2SAT   Coagulation Profile: No results for input(s): INR, PROTIME in the last 168 hours.  Cardiac Enzymes: No results for input(s): CKTOTAL, CKMB, CKMBINDEX, TROPONINI in the last 168 hours.  HbA1C: No results found for: HGBA1C  CBG: No results for input(s): GLUCAP in the last 168 hours.  Review of Systems:   Review of  Systems  Constitutional: Positive for diaphoresis, fever and malaise/fatigue.  HENT: Negative.   Eyes: Negative.   Respiratory: Positive for cough and shortness of breath. Negative for sputum production.   Cardiovascular: Positive for chest pain. Negative for palpitations, leg swelling and PND.  Gastrointestinal: Negative.   Genitourinary: Negative.   Musculoskeletal: Negative.   Skin: Negative.   Neurological: Negative.   Endo/Heme/Allergies: Negative.   Psychiatric/Behavioral: Negative.      Past Medical History  He,  has a past medical history of Collapsed lung and Seasonal allergies.   Surgical History    Past Surgical History:  Procedure Laterality Date   CHEST TUBE INSERTION     HAND SURGERY       Social History   reports that he has never smoked. He has never used smokeless tobacco. He reports previous drug use. He reports that he does not drink alcohol.   Family History   His family history is not on file.   Allergies Allergies  Allergen Reactions   Tramadol     Sweating      Home Medications  Prior to Admission medications   Medication Sig Start Date End Date Taking? Authorizing Provider  montelukast (SINGULAIR) 10 MG tablet Take 10 mg by mouth at bedtime.   Yes [provider]    Simonne Martinet ACNP-BC [facility] Pulmonary/Critical Care Pager # [phone] OR # [phone] if no answer

## 2019-05-15 NOTE — Plan of Care (Signed)
Poc progressing.  

## 2019-05-16 ENCOUNTER — Inpatient Hospital Stay (HOSPITAL_COMMUNITY): Payer: Managed Care, Other (non HMO)

## 2019-05-16 DIAGNOSIS — J9311 Primary spontaneous pneumothorax: Secondary | ICD-10-CM

## 2019-05-16 DIAGNOSIS — J189 Pneumonia, unspecified organism: Secondary | ICD-10-CM

## 2019-05-16 DIAGNOSIS — J984 Other disorders of lung: Secondary | ICD-10-CM

## 2019-05-16 DIAGNOSIS — R918 Other nonspecific abnormal finding of lung field: Secondary | ICD-10-CM

## 2019-05-16 LAB — PROCALCITONIN: Procalcitonin: <0.1 ng/mL

## 2019-05-16 LAB — C-REACTIVE PROTEIN: CRP: 16.1 mg/dL — ABNORMAL HIGH (ref <1.0)

## 2019-05-16 LAB — LEGIONELLA PNEUMOPHILA SEROGP 1 UR AG: L. pneumophila Serogp 1 Ur Ag: NEGATIVE

## 2019-05-16 NOTE — Progress Notes (Addendum)
[address]       [place] [address]             [phone]      2 Days Post-Op Procedure(s) (LRB): CHEST TUBE INSERTION (Right) Subjective: Feels fine  Objective: Vital signs in last 24 hours: Temp:  [97.9 F (36.6 C)-98.8 F (37.1 C)] 98.8 F (37.1 C) (09/24 0406) Pulse Rate:  [66-78] 66 (09/24 0406) Cardiac Rhythm: Normal sinus rhythm (09/24 0540) Resp:  [17-21] 19 (09/24 0406) BP: (118-130)/(75-89) 130/85 (09/24 0406) SpO2:  [96 %-99 %] 96 % (09/24 0406) Weight:  [75.8 kg] 75.8 kg (09/24 0500)  Hemodynamic parameters for last 24 hours:    Intake/Output from previous day: 09/23 0701 - 09/24 0700 In: 830 [P.O.:480; IV Piggyback:350] Out: 1100 [Urine:1100] Intake/Output this shift: No intake/output data recorded.  General appearance: alert, cooperative and no distress Heart: regular rate and rhythm Lungs: coarse broncho-vesicular BS Abdomen: benign Extremities: no edema or calf tenderness Wound: n/a  Lab Results: Recent Labs    05/14/19 0910 05/15/19 0220  WBC 18.0* 15.5*  HGB 14.0 13.8  HCT 41.7 39.5  PLT 333 332   BMET:  Recent Labs    05/14/19 0910 05/15/19 0220  NA 134* 132*  K 4.1 3.3*  CL 96* 95*  CO2 25 24  GLUCOSE 119* 106*  BUN 7 7  CREATININE 0.89 0.84  CALCIUM 9.0 8.3*    PT/INR: No results for input(s): LABPROT, INR in the last 72 hours. ABG No results found for: PHART, HCO3, TCO2, ACIDBASEDEF, O2SAT CBG (last 3)  No results for input(s): GLUCAP in the last 72 hours.  Meds Scheduled Meds:  enoxaparin (LOVENOX) injection  40 mg Subcutaneous Q24H   montelukast  10 mg Oral QHS   sodium chloride flush  3 mL Intravenous Q12H   Continuous Infusions:  azithromycin Stopped (05/15/19 1816)   cefTRIAXone (ROCEPHIN)  IV Stopped (05/15/19 1607)   PRN Meds:.acetaminophen, docusate sodium, metoprolol tartrate, ondansetron **OR** ondansetron (ZOFRAN) IV, oxyCODONE  Xrays Ct Chest W Contrast  Result  Date: 05/15/2019 CLINICAL DATA:  Spontaneous pneumothorax. Status post chest tube placement. EXAM: CT CHEST WITH CONTRAST TECHNIQUE: Multidetector CT imaging of the chest was performed during intravenous contrast administration. CONTRAST:  <See Chart> OMNIPAQUE IOHEXOL 300 MG/ML  SOLN COMPARISON:  Chest radiograph 05/14/2019 FINDINGS: Cardiovascular: No significant vascular findings. Normal heart size. No pericardial effusion. Mediastinum/Nodes: Normal appearance of the thyroid gland. The trachea appears patent and is midline. Normal appearance of the esophagus. 1.5 cm left paratracheal lymph node identified, image 59/3. The right hilar lymph node measures 1.2 cm, image 75/3. Lungs/Pleura: No pleural effusion. Paraseptal emphysema. Right-sided percutaneous chest tube is identified with pigtail within the minor fissure. Near complete resolution of previous right pneumothorax. Small residual scratch set tiny residual pneumothorax in the anteromedial right apex. There is diffuse ground-glass attenuation throughout the left lung with diffuse interstitial thickening. Multifocal patchy areas of ground-glass attenuation and airspace consolidation noted within the right lung. The largest area is in the posterolateral right middle lobe, image 106/5. Upper Abdomen: No acute abnormality. Musculoskeletal: No chest wall abnormality. No acute or significant osseous findings. IMPRESSION: 1. Right-sided chest tube remains in place. Near complete resolution of right pneumothorax with tiny residual component in the anteromedial right apex. 2. Multifocal areas of ground-glass attenuation and airspace consolidation involving the right upper lobe, right middle lobe and right lower lobe. Findings may be the sequelae of recent large pneumothorax. Multifocal infection not  excluded. 3. Diffuse ground-glass attenuation and interstitial thickening is noted throughout the left lung. This may represent asymmetric edema versus diffuse atypical  infection 4. Enlarged left paratracheal lymph node is identified. Nonspecific. The setting of infection this is favored to represent reactive adenopathy. Follow-up imaging in 3 months with repeat CT of the chest is advised to ensure resolution. This recommendation follows ACR consensus guidelines: Managing Incidental Findings on Thoracic CT: Mediastinal and Cardiovascular Findings. A White Paper of the ACR Incidental Findings Committee. J Am Coll Radiol. 2018; 15: 9326-7124. 5.  Aortic Atherosclerosis (ICD10-I70.0). Electronically Signed   By: Signa Kell M.D.   On: 05/15/2019 09:21   Dg Chest Port 1 View  Result Date: 05/15/2019 CLINICAL DATA:  Chest 2 present, right pneumothorax EXAM: PORTABLE CHEST 1 VIEW COMPARISON:  05/14/2019 FINDINGS: Right-sided chest tube remains in position without appreciable right-sided pneumothorax. There is new, subtle heterogeneous opacity of the right lung base, which may reflect atelectasis or developing airspace disease. Unchanged, diffuse left-sided heterogeneous airspace opacity. Mild cardiomegaly. IMPRESSION: 1. Right-sided chest tube remains in position without appreciable right-sided pneumothorax. 2. There is new, subtle heterogeneous opacity of the right lung base, which may reflect atelectasis or developing airspace disease. 3.  Unchanged, diffuse left-sided heterogeneous airspace opacity. Electronically Signed   By: Lauralyn Primes M.D.   On: 05/15/2019 09:24   Dg Chest Port 1 View  Result Date: 05/14/2019 CLINICAL DATA:  Post chest tube insertion EXAM: PORTABLE CHEST 1 VIEW COMPARISON:  05/14/2019 FINDINGS: Interval placement of pigtail pleural catheter on the right with re-expansion of the right lung. Probable small residual right apical pneumothorax. Airspace disease throughout the left lung is stable. Heart is normal size. No effusions. No acute bony abnormality. IMPRESSION: Interval placement of right pleural catheter with re-expansion of the right lung. Small  residual right apical pneumothorax. Diffuse left lung airspace disease, stable. Electronically Signed   By: Charlett Nose M.D.   On: 05/14/2019 18:16   Dg Chest Port 1 View  Result Date: 05/14/2019 CLINICAL DATA:  Shortness of breath EXAM: PORTABLE CHEST 1 VIEW COMPARISON:  None. FINDINGS: There is a moderate to large right-sided pneumothorax with leftward mediastinal shift. There is hazy airspace opacity seen throughout the left lung. No acute osseous findings. IMPRESSION: Moderate to large right-sided pneumothorax. These results were called by telephone at the time of interpretation on 05/14/2019 at 9:34 am to provider [facility] , who verbally acknowledged these results. Electronically Signed   By: Jonna Clark M.D.   On: 05/14/2019 09:39    Assessment/Plan: S/P Procedure(s) (LRB): CHEST TUBE INSERTION (Right)  1 doing well, no air leak or PNTX on CXR- will go to water seal 2 appreciate pulmonology input, on zithromax and rocephin, singulair, appropriate labs ordered 3 hoping to avoid surgery, poss talc pleurodesis prior to tube removal  LOS: 2 days    Rowe Clack PA-C 05/16/2019 Pager [phone]  Plan talc pleurodesis tomorrow if lung stays up on water seal patient examined and medical record reviewed,agree with above note. Kathlee Nations Trigt III 05/16/2019

## 2019-05-16 NOTE — Progress Notes (Addendum)
NAME:  Blake Benson, MRN:  [id number], DOB:  30-May-1971, LOS: 2 ADMISSION DATE:  05/14/2019, CONSULTATION DATE:  9/23 REFERRING MD:  Lawson Fiscal, CHIEF COMPLAINT:  Pulmonary infiltrates    Brief History   48 year old black male admitted 9/22 with spontaneous right pneumothorax (this is his second pneumothorax on the right).  Also had left greater than right airspace disease.  Pulmonary asked to evaluate due to abnormal CT chest findings   Past Medical History  Prior right spontaneous pneumothorax,, Seasonal allergies.  Prior tobacco abuse  Significant Hospital Events   9/22 admitted by thoracic surgery right chest tube placed  Consults:  Pulmonary consulted 9/2 3  Procedures:  Right pigtail catheter placement 9/22  Significant Diagnostic Tests:  CT chest: Right-sided chest tube in place.  Minimal residual pneumothorax.  Multifocal groundglass changes predominantly on the left side.  There is consolidative and groundglass changes in the right lower lobe the right upper lobe is largely spared  Micro Data:   COVID-19 9/22 negative  Urine strep antigen 9/23>>> Negative Urine Legionella antigen 9/23>>> RVP 9/23>>>  Antimicrobials:  azith 9/23>>> Rocephin 9/23>>>  Interim history/subjective:  Alert and resting in bed this am. States he feels much better than on admission.  Denies shortness of breath or chest pain  Objective   Blood pressure 114/83, pulse 75, temperature 98.6 F (37 C), temperature source Oral, resp. rate 18, height '6\' 5"'$  (1.956 m), weight 75.8 kg, SpO2 96 %.        Intake/Output Summary (Last 24 hours) at 05/16/2019 0944 Last data filed at 05/16/2019 0806 Gross per 24 hour  Intake 590 ml  Output 1500 ml  Net -910 ml   Filed Weights   05/14/19 1827 05/15/19 0500 05/16/19 0500  Weight: 77.1 kg 74.6 kg 75.8 kg    Examination: General: Adult male resting in bed HENT: [place]/AT  Lungs: Lungs CTA, symmetric expansion.  CT to right, no air leak Cardiovascular:  Warm and well perfused, no edema, S1, S2 Abdomen: Soft, Non-distended, Non-tender Extremities: No deformities Neuro: A&O, no focal deficits, + sensation  Resolved Hospital Problem list     Assessment & Plan:   Spontaneous right pneumothorax -CT to water seal today per CTS.  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:   ESR: 100  CCP: Pending  ANA:  Pending -Empiric CAP coverage: Azithro & Rocephin D2  Strep urinary antigen: Negative   Respiratory Panel: Negative  Legionella: Pending    Labs   CBC: Recent Labs  Lab 05/14/19 0910 05/15/19 0220  WBC 18.0* 15.5*  NEUTROABS 13.8*  --   HGB 14.0 13.8  HCT 41.7 39.5  MCV 92.3 90.6  PLT 333 008    Basic Metabolic Panel: Recent Labs  Lab 05/14/19 0910 05/15/19 0220  NA 134* 132*  K 4.1 3.3*  CL 96* 95*  CO2 25 24  GLUCOSE 119* 106*  BUN 7 7  CREATININE 0.89 0.84  CALCIUM 9.0 8.3*   GFR: Estimated Creatinine Clearance: 114.1 mL/min (by C-G formula based on SCr of 0.84 mg/dL). Recent Labs  Lab 05/14/19 0910 05/14/19 1115 05/15/19 0220  WBC 18.0*  --  15.5*  LATICACIDVEN 1.8 1.3  --     Liver Function Tests: Recent Labs  Lab 05/14/19 0910  AST 65*  ALT 43  ALKPHOS 77  BILITOT 1.0  PROT 8.3*  ALBUMIN 3.3*   No  results for input(s): LIPASE, AMYLASE in the last 168 hours. No results for input(s): AMMONIA in the last 168 hours.  ABG No results found for: PHART, PCO2ART, PO2ART, HCO3, TCO2, ACIDBASEDEF, O2SAT   Coagulation Profile: No results for input(s): INR, PROTIME in the last 168 hours.  Cardiac Enzymes: No results for input(s): CKTOTAL, CKMB, CKMBINDEX, TROPONINI in the last 168 hours.  HbA1C: No results found for: HGBA1C  CBG: No results for input(s): GLUCAP in the last 168 hours.    Past Medical History  He,  has a past medical history of Collapsed lung and  Seasonal allergies.   Surgical History    Past Surgical History:  Procedure Laterality Date  . CHEST TUBE INSERTION    . HAND SURGERY       Social History   reports that he has never smoked. He has never used smokeless tobacco. He reports previous drug use. He reports that he does not drink alcohol.   Family History   His family history is not on file.   Allergies Allergies  Allergen Reactions  . Tramadol     Sweating      Home Medications  Prior to Admission medications   Medication Sig Start Date End Date Taking? Authorizing Provider  montelukast (SINGULAIR) 10 MG tablet Take 10 mg by mouth at bedtime.   Yes [provider]    Paulita Fujita, ACNP  Pulmonary & Critical Care  # [phone] for afterhours  Attending Note:  48 year old male with PMH above presenting with GGO on the left and PTX on the right s/p CT placement by CVTS.  No events overnight, no new complaints.  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  ESR as above rest of the auto-immune work up pending.  Discussed with PCCM-NP.  PTX:  - CT to water seal  - Needs pleurodesis ?talc vs mechanical given age  GGO:  - F/U auto-immune disease profile  CAP:  - Check procal, if negative then will d/c abx  Cystic changes: differential as above  - HRCT of the chest  - Can f/u as outpatient  PCCM will see again on 9/25 to f/u on the rest of the auto-immune work up  Patient seen and examined, agree with above note.  I dictated the care and orders written for this patient under my direction.  Rush Farmer, Michie

## 2019-05-16 NOTE — Progress Notes (Signed)
Patient up to chair with one assist. Blake Benson, Bettina Gavia RN

## 2019-05-16 NOTE — Progress Notes (Signed)
Chest tube placed to water seal as ordered. Will monitor patient. Meesha Sek, Bettina Gavia rN

## 2019-05-17 ENCOUNTER — Inpatient Hospital Stay (HOSPITAL_COMMUNITY): Payer: Managed Care, Other (non HMO)

## 2019-05-17 ENCOUNTER — Encounter (HOSPITAL_COMMUNITY)
Admission: EM | Disposition: A | Discharge status: Home / Self Care | Payer: Self-pay | Source: Home / Self Care | Attending: Cardiothoracic Surgery

## 2019-05-17 DIAGNOSIS — J849 Interstitial pulmonary disease, unspecified: Secondary | ICD-10-CM

## 2019-05-17 HISTORY — PX: VIDEO BRONCHOSCOPY: SHX5072

## 2019-05-17 LAB — BODY FLUID CELL COUNT WITH DIFFERENTIAL
Eos, Fluid: 4 %
Lymphs, Fluid: 25 %
Monocyte-Macrophage-Serous Fluid: 31 % — ABNORMAL LOW (ref 50–90)
Neutrophil Count, Fluid: 40 % — ABNORMAL HIGH (ref 0–25)
Total Nucleated Cell Count, Fluid: 655 cu mm (ref 0–1000)

## 2019-05-17 LAB — ANA W/REFLEX IF POSITIVE: Anti Nuclear Antibody (ANA): NEGATIVE

## 2019-05-17 LAB — ANGIOTENSIN CONVERTING ENZYME: Angiotensin-Converting Enzyme: 19 U/L (ref 14–82)

## 2019-05-17 LAB — RAPID URINE DRUG SCREEN, HOSP PERFORMED
Amphetamines: NOT DETECTED
Barbiturates: NOT DETECTED
Benzodiazepines: NOT DETECTED
Cocaine: NOT DETECTED
Opiates: NOT DETECTED
Tetrahydrocannabinol: POSITIVE — AB

## 2019-05-17 LAB — ANCA TITERS
Atypical P-ANCA titer: <1:20 {titer}
C-ANCA: <1:20 {titer}
P-ANCA: <1:20 {titer}

## 2019-05-17 LAB — GLOMERULAR BASEMENT MEMBRANE ANTIBODIES: GBM Ab: 3 units (ref 0–20)

## 2019-05-17 SURGERY — VIDEO BRONCHOSCOPY WITHOUT FLUORO
Anesthesia: Moderate Sedation | Laterality: Bilateral

## 2019-05-17 MED ORDER — MIDAZOLAM HCL (PF) 10 MG/2ML IJ SOLN
INTRAMUSCULAR | Status: DC | PRN
  Administered 2019-05-17: 2 mg via INTRAVENOUS

## 2019-05-17 MED ORDER — PHENYLEPHRINE HCL 0.25 % NA SOLN
NASAL | Status: DC | PRN
  Administered 2019-05-17: 2 via NASAL

## 2019-05-17 MED ORDER — TALC (STERITALC) POWDER FOR INTRAPLEURAL USE: 4.0000 g | Freq: Once | INTRAVENOUS | Status: DC

## 2019-05-17 MED ORDER — SODIUM CHLORIDE 0.9 % IV SOLN
INTRAVENOUS | Status: DC
  Administered 2019-05-17: 13:00:00 via INTRAVENOUS

## 2019-05-17 MED ORDER — MORPHINE SULFATE (PF) 4 MG/ML IV SOLN
INTRAVENOUS | Status: AC
  Administered 2019-05-17: 11:00:00 4 mg
  Filled 2019-05-17: qty 1

## 2019-05-17 MED ORDER — LIDOCAINE HCL 1 % IJ SOLN
INTRAMUSCULAR | Status: DC | PRN
  Administered 2019-05-17: 6 mL via RESPIRATORY_TRACT

## 2019-05-17 MED ORDER — TALC (STERITALC) POWDER FOR INTRAPLEURAL USE
4.0000 g | Freq: Once | INTRAVENOUS | Status: AC
  Administered 2019-05-17: 11:00:00 4 g via INTRAPLEURAL
  Filled 2019-05-17: qty 4

## 2019-05-17 MED ORDER — FENTANYL CITRATE (PF) 100 MCG/2ML IJ SOLN
INTRAMUSCULAR | Status: AC
  Filled 2019-05-17: qty 4

## 2019-05-17 MED ORDER — LIDOCAINE HCL 1 % IJ SOLN
5.0000 mL | Freq: Once | INTRAMUSCULAR | Status: AC
  Administered 2019-05-17: 11:00:00 5 mL via INTRAPLEURAL
  Filled 2019-05-17 (×2): qty 5

## 2019-05-17 MED ORDER — FENTANYL CITRATE (PF) 100 MCG/2ML IJ SOLN
INTRAMUSCULAR | Status: DC | PRN
  Administered 2019-05-17 (×3): 25 ug via INTRAVENOUS

## 2019-05-17 MED ORDER — LIDOCAINE HCL URETHRAL/MUCOSAL 2 % EX GEL
CUTANEOUS | Status: DC | PRN
  Administered 2019-05-17: 1

## 2019-05-17 MED ORDER — MIDAZOLAM HCL (PF) 5 MG/ML IJ SOLN
INTRAMUSCULAR | Status: AC
  Filled 2019-05-17: qty 2

## 2019-05-17 NOTE — Progress Notes (Signed)
[address]       [place] [id number]             [phone]      3 Days Post-Op Procedure(s) (LRB): CHEST TUBE INSERTION (Right) Subjective: Remains stable without pntx  Objective: Vital signs in last 24 hours: Temp:  [98.1 F (36.7 C)-99.2 F (37.3 C)] 98.1 F (36.7 C) (09/25 0511) Pulse Rate:  [60-72] 60 (09/25 0511) Cardiac Rhythm: Normal sinus rhythm (09/25 0700) Resp:  [18-20] 19 (09/25 0511) BP: (127-138)/(75-90) 127/90 (09/25 0511) SpO2:  [96 %-97 %] 96 % (09/25 0046) Weight:  [73.9 kg] 73.9 kg (09/25 0511)  Hemodynamic parameters for last 24 hours:    Intake/Output from previous day: 09/24 0701 - 09/25 0700 In: 513.5 [P.O.:480; IV Piggyback:33.5] Out: 2300 [Urine:2300] Intake/Output this shift: No intake/output data recorded.  Alert, NAD Lungs clear Cor RRR   Lab Results: Recent Labs    05/14/19 0910 05/15/19 0220  WBC 18.0* 15.5*  HGB 14.0 13.8  HCT 41.7 39.5  PLT 333 332   BMET:  Recent Labs    05/14/19 0910 05/15/19 0220  NA 134* 132*  K 4.1 3.3*  CL 96* 95*  CO2 25 24  GLUCOSE 119* 106*  BUN 7 7  CREATININE 0.89 0.84  CALCIUM 9.0 8.3*    PT/INR: No results for input(s): LABPROT, INR in the last 72 hours. ABG No results found for: PHART, HCO3, TCO2, ACIDBASEDEF, O2SAT CBG (last 3)  No results for input(s): GLUCAP in the last 72 hours.  Meds Scheduled Meds:  enoxaparin (LOVENOX) injection  40 mg Subcutaneous Q24H   montelukast  10 mg Oral QHS   sodium chloride flush  3 mL Intravenous Q12H   Continuous Infusions:  azithromycin 200 mL/hr at 05/16/19 1600   cefTRIAXone (ROCEPHIN)  IV 2 g (05/16/19 1746)   PRN Meds:.acetaminophen, docusate sodium, metoprolol tartrate, ondansetron **OR** ondansetron (ZOFRAN) IV, oxyCODONE  Xrays Ct Chest W Contrast  Result Date: 05/15/2019 CLINICAL DATA:  Spontaneous pneumothorax. Status post chest tube placement. EXAM: CT CHEST WITH CONTRAST TECHNIQUE:  Multidetector CT imaging of the chest was performed during intravenous contrast administration. CONTRAST:  <See Chart> OMNIPAQUE IOHEXOL 300 MG/ML  SOLN COMPARISON:  Chest radiograph 05/14/2019 FINDINGS: Cardiovascular: No significant vascular findings. Normal heart size. No pericardial effusion. Mediastinum/Nodes: Normal appearance of the thyroid gland. The trachea appears patent and is midline. Normal appearance of the esophagus. 1.5 cm left paratracheal lymph node identified, image 59/3. The right hilar lymph node measures 1.2 cm, image 75/3. Lungs/Pleura: No pleural effusion. Paraseptal emphysema. Right-sided percutaneous chest tube is identified with pigtail within the minor fissure. Near complete resolution of previous right pneumothorax. Small residual scratch set tiny residual pneumothorax in the anteromedial right apex. There is diffuse ground-glass attenuation throughout the left lung with diffuse interstitial thickening. Multifocal patchy areas of ground-glass attenuation and airspace consolidation noted within the right lung. The largest area is in the posterolateral right middle lobe, image 106/5. Upper Abdomen: No acute abnormality. Musculoskeletal: No chest wall abnormality. No acute or significant osseous findings. IMPRESSION: 1. Right-sided chest tube remains in place. Near complete resolution of right pneumothorax with tiny residual component in the anteromedial right apex. 2. Multifocal areas of ground-glass attenuation and airspace consolidation involving the right upper lobe, right middle lobe and right lower lobe. Findings may be the sequelae of recent large pneumothorax. Multifocal infection not excluded. 3. Diffuse ground-glass attenuation and interstitial thickening is noted throughout the left lung.  This may represent asymmetric edema versus diffuse atypical infection 4. Enlarged left paratracheal lymph node is identified. Nonspecific. The setting of infection this is favored to represent  reactive adenopathy. Follow-up imaging in 3 months with repeat CT of the chest is advised to ensure resolution. This recommendation follows ACR consensus guidelines: Managing Incidental Findings on Thoracic CT: Mediastinal and Cardiovascular Findings. A White Paper of the ACR Incidental Findings Committee. J Am Coll Radiol. 2018; 15: 0174-9449. 5.  Aortic Atherosclerosis (ICD10-I70.0). Electronically Signed   By: Kerby Moors M.D.   On: 05/15/2019 09:21   Ct Chest High Resolution  Result Date: 05/17/2019 CLINICAL DATA:  Evaluate for ILD EXAM: CT CHEST WITHOUT CONTRAST TECHNIQUE: Multidetector CT imaging of the chest was performed following the standard protocol without intravenous contrast. High resolution imaging of the lungs, as well as inspiratory and expiratory imaging, was performed. COMPARISON:  05/15/2019, chest radiographs, 12/06/2016 FINDINGS: Cardiovascular: No significant vascular findings. Normal heart size. No pericardial effusion. Mediastinum/Nodes: No change in enlarged left paratracheal lymph node measuring 1.3 cm in short axis, better appreciated by prior contrast enhanced examination (series 10, image 57). Thyroid gland, trachea, and esophagus demonstrate no significant findings. Lungs/Pleura: Mild paraseptal emphysema. There is slight interval improvement in diffuse ground-glass opacity and interlobular septal thickening of the left lung, with minimal areas patchy ground-glass and consolidative opacity of the right lung, likewise slightly improved. There is no significant air trapping on expiratory phase imaging. Prone imaging suggests the presence of fibrotic scarring in the dependent portions of the left lung (series 9, image 46). A right-sided pigtail chest tube remains positioned in the major fissure. No significant residual pneumothorax. Benign 4 mm fissural nodule of the superior segment right lower lobe. Upper Abdomen: No acute abnormality. Musculoskeletal: No chest wall mass or  suspicious bone lesions identified. IMPRESSION: 1. A right-sided pigtail chest tube remains positioned in the major fissure. No significant residual pneumothorax. 2. There is slight interval improvement in diffuse ground-glass opacity and interlobular septal thickening of the left lung, with minimal areas patchy ground-glass and consolidative opacity of the right lung, likewise slightly improved. Findings are most consistent with improved atypical infection. 3. Prone imaging suggests the presence of some degree of fibrotic scarring in the dependent portions of the left lung (e.g. series 9, image 46), however there are no definitive findings for fibrotic interstitial lung disease and this unilateral distribution favors sequelae of prior infection or inflammation. 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 Notably, these findings appear to be new when compared to centrally normal chest radiographs as recently as 12/06/2016. 4.  Mild underlying emphysema. 5. No change in enlarged left paratracheal lymph node measuring 1.3 cm in short axis, better appreciated by prior contrast enhanced examination (series 10, image 57). Electronically Signed   By: Eddie Candle M.D.   On: 05/17/2019 08:29   Dg Chest Port 1 View  Result Date: 05/16/2019 CLINICAL DATA:  RIGHT pneumothorax, chest tube EXAM: PORTABLE CHEST 1 VIEW COMPARISON:  Portable exam 0631 hours compared to 05/15/2019 FINDINGS: Pigtail RIGHT thoracostomy tube again seen. Normal heart size, mediastinal contours and pulmonary vascularity. Diffuse LEFT lung infiltrates consistent with pneumonia, slightly improved in LEFT upper lobe. Minimal RIGHT basilar atelectasis. No pleural effusion or residual pneumothorax. Bones unremarkable. IMPRESSION: RIGHT  thoracostomy tube with mild RIGHT basilar atelectasis but no pneumothorax. Diffuse  LEFT lung infiltrate consistent with pneumonia, slightly improved in LEFT upper lobe. Electronically Signed   By: Ulyses Southward M.D.   On: 05/16/2019 09:21    Assessment/Plan: S/P Procedure(s) (LRB): CHEST TUBE INSERTION (Right)  1 stable 2 pulm plans bronchoscopy this am 3 chest tube without air leak, no pntx on CXR- plan is for talc pleurodesis this am  4 poss home in am   LOS: 3 days    Rowe Clack [facility] 05/17/2019 Pager [phone]

## 2019-05-17 NOTE — Progress Notes (Addendum)
NAME:  Blake Benson, MRN:  [id number], DOB:  04/11/1971, LOS: 3 ADMISSION DATE:  05/14/2019, CONSULTATION DATE:  9/23 REFERRING MD:  Lawson Fiscal, CHIEF COMPLAINT:  Pulmonary infiltrates    Brief History   48 year old black male admitted 9/22 with spontaneous right pneumothorax (this is his second pneumothorax on the right).  Also had left greater than right airspace disease.  Pulmonary asked to evaluate due to abnormal CT chest findings   Past Medical History  Prior right spontaneous pneumothorax,, Seasonal allergies.  Prior tobacco abuse  Significant Hospital Events   9/22 admitted by thoracic surgery right chest tube placed 9/24 - Alert and resting in bed this am. States he feels much better than on admission.  Denies shortness of breath or chest pain  Consults:  Pulmonary consulted 9/2 3  Procedures:  Right pigtail catheter placement 9/22  Significant Diagnostic Tests:  CT chest: Right-sided chest tube in place.  Minimal residual pneumothorax.  Multifocal groundglass changes predominantly on the left side.  There is consolidative and groundglass changes in the right lower lobe the right upper lobe is largely spared  Micro Data:   COVID-19 9/22 negative  Urine strep antigen 9/23>>> Negative Urine Legionella antigen 9/23>>> RVP 9/23>>>  Antimicrobials:  azith 9/23>>> Rocephin 9/23>>>  Interim history/subjective:   9/25 - on room air. Apprhensive about prolonged length of stay. Just getting aware that he would need right talc pleurodesis (APP from CVTs in to talk to him). Admis to MJ use. Lives in  68+ year old home. Denies mold exposure. Visits ex-significant other home a lot - there are down pillows there and down blanket. ESR is 100 with RVP/COVID - negative /. Desperate to go back to home and work. Worried bronch can prolong LOS   [employer] Comprehensive ILD Questionnaire  Symptoms:  -Reports sudden onset of dyspnea very slowly getting better.  Dyspnea is been  present for 5 days.  There is no episodic dyspnea.  Symptom severity is listed below.  There are no other conditions affecting his dyspnea.  He also reports chronic cough since September 18th, 2020.  Slowly getting better.  There is mild.  There is no cough at night.  It is a dry cough no hemoptysis.  No other associated cough symptoms.  No wheezing.   SYMPTOM SCALE - ILD 05/17/2019   O2 use RA  Shortness of Breath 0 -> 5 scale with 5 being worst (score 6 If unable to do)  At rest 3  Simple tasks - showers, clothes change, eating, shaving 2  Household (dishes, doing bed, laundry) 4  Shopping 3  Walking level at own pace 2  Walking keeping up with others of same age 52  Walking up Stairs 4  Walking up [place] 4  Total (40 - 48) Dyspnea Score 25  How bad is your cough? yes  How bad is your fatigue x       Past Medical History : Negative for asthma COPD.  Negative for heart failure.  Negative for collagen vascular disease including rheumatoid arthritis, scleroderma, lupus, polymyositis, Sjogren's, acid reflux or hiatal hernia.  Denies any sleep apnea.  No immune system disorder.  No pulmonary hypertension.  No diabetes.  Denies thyroid disease or stroke.  Denies seizures.  Denies hepatitis.  Denies tuberculosis.  Denies kidney disease.  No pneumonia no blood clots no heart disease no pleurisy.   ROS: Denies fatigue arthralgia or dysphagia.  No dry eyes.  No Raynard.  No recurrent fever..  No weight  loss or acid reflux.  No snoring.  No rash no ulcers.   FAMILY HISTORY of LUNG DISEASE: Denies family history of COPD or pulmonary fibrosis or any other lung problems.   EXPOSURE HISTORY: Smoking cigarettes since 1989.  Quit smoking in 2016.  2 packs/day.  He smoked cigars in the past for 15 years.  Currently smokes marijuana he started smoking marijuana in 1990.  He still smokes.  4 to 5/day.  No cocaine use.  No intravenous drug use.  No electronic cigarette use or no electronic marijuana use.   Lives in a rental home   HOME and [place] : Rental home for the last 2 years and [place].  He says the home is 76 + years old but denies any mold or mildew exposure.   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  Does not work in a dusty environment.  No exposure to chemicals or fumes   PULMONARY TOXICITY HISTORY (27 items): Denies.     Objective   Blood pressure 127/90, pulse 60, temperature 98.1 F (36.7 C), temperature source Oral, resp. rate 19, height _0  (1.956 m), weight 73.9 kg, SpO2 96 %.        Intake/Output Summary (Last 24 hours) at 05/17/2019 0902 Last data filed at 05/17/2019 0540 Gross per 24 hour  Intake 513.54 ml  Output 1900 ml  Net -1386.46 ml   Filed Weights   05/15/19 0500 05/16/19 0500 05/17/19 0511  Weight: 74.6 kg 75.8 kg 73.9 kg    General Appearance:  Looks well Head:  Normocephalic, without obvious abnormality, atraumatic Eyes:  PERRL - yes, conjunctiva/corneas - muddy     Ears:  Normal external ear canals, both ears Nose:  G tube - no Throat:  ETT TUBE - no , OG tube - no Neck:  Supple,  No enlargement/tenderness/nodules Lungs: No resp distress Heart:  S1 and S2 normal, no murmur, CVP - no.  Pressors - no Abdomen:  Soft, no masses, no organomegaly Genitalia / Rectal:  Not done Extremities:  Extremities- intact Skin:  ntact in exposed areas . Sacral area - none Neurologic:  Sedation - none -> RASS - +1 . Moves all 4s - yes. CAM-ICU - neg . Orientation - x3+      LABS    ESR 100 PCT - < 0.1 RVPand covid - negative  PULMONARY No results for input(s): PHART, PCO2ART, PO2ART, HCO3, TCO2, O2SAT in the last 168 hours.  Invalid input(s): PCO2, PO2  CBC Recent Labs  Lab 05/14/19 0910 05/15/19 0220  HGB 14.0 13.8  HCT 41.7 39.5   WBC 18.0* 15.5*  PLT 333 332    COAGULATION No results for input(s): INR in the last 168 hours.  CARDIAC  No results for input(s): TROPONINI in the last 168 hours. No results for input(s): PROBNP in the last 168 hours.   CHEMISTRY Recent Labs  Lab 05/14/19 0910 05/15/19 0220  NA 134* 132*  K 4.1 3.3*  CL 96* 95*  CO2 25 24  GLUCOSE 119* 106*  BUN 7 7  CREATININE 0.89 0.84  CALCIUM 9.0 8.3*   Estimated Creatinine Clearance: 111.2 mL/min (by C-G formula based on SCr of 0.84 mg/dL).   LIVER Recent Labs  Lab 05/14/19 0910  AST 65*  ALT 43  ALKPHOS 77  BILITOT 1.0  PROT 8.3*  ALBUMIN 3.3*     INFECTIOUS Recent Labs  Lab 05/14/19 0910 05/14/19 1115 05/16/19 1415  LATICACIDVEN 1.8 1.3  --   PROCALCITON  --   --  <0.10     ENDOCRINE CBG (last 3)  No results for input(s): GLUCAP in the last 72 hours.       IMAGING x48h  - image(s) personally visualized  -   highlighted in bold Ct Chest High Resolution  Result Date: 05/17/2019 CLINICAL DATA:  Evaluate for ILD EXAM: CT CHEST WITHOUT CONTRAST TECHNIQUE: Multidetector CT imaging of the chest was performed following the standard protocol without intravenous contrast. High resolution imaging of the lungs, as well as inspiratory and expiratory imaging, was performed. COMPARISON:  05/15/2019, chest radiographs, 12/06/2016 FINDINGS: Cardiovascular: No significant vascular findings. Normal heart size. No pericardial effusion. Mediastinum/Nodes: No change in enlarged left paratracheal lymph node measuring 1.3 cm in short axis, better appreciated by prior contrast enhanced examination (series 10, image 57). Thyroid gland, trachea, and esophagus demonstrate no significant findings. Lungs/Pleura: Mild paraseptal emphysema. There is slight interval improvement in diffuse ground-glass opacity and interlobular septal thickening of the left lung, with minimal areas patchy ground-glass and consolidative opacity of the right  lung, likewise slightly improved. There is no significant air trapping on expiratory phase imaging. Prone imaging suggests the presence of fibrotic scarring in the dependent portions of the left lung (series 9, image 46). A right-sided pigtail chest tube remains positioned in the major fissure. No significant residual pneumothorax. Benign 4 mm fissural nodule of the superior segment right lower lobe. Upper Abdomen: No acute abnormality. Musculoskeletal: No chest wall mass or suspicious bone lesions identified. IMPRESSION: 1. A right-sided pigtail chest tube remains positioned in the major fissure. No significant residual pneumothorax. 2. There is slight interval improvement in diffuse ground-glass opacity and interlobular septal thickening of the left lung, with minimal areas patchy ground-glass and consolidative opacity of the right lung, likewise slightly improved. Findings are most consistent with improved atypical infection. 3. Prone imaging suggests the presence of some degree of fibrotic scarring in the dependent portions of the left lung (e.g. series 9, image 46), however there are no definitive findings for fibrotic interstitial lung disease and this unilateral distribution favors sequelae of prior infection or inflammation. 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 Notably, these findings appear to be new when compared to centrally normal chest radiographs as recently as 12/06/2016. 4.  Mild underlying emphysema. 5. No change in enlarged left paratracheal lymph node measuring 1.3 cm in short axis, better appreciated by prior contrast enhanced examination (series 10, image 57). Electronically Signed   By: Eddie Candle M.D.   On: 05/17/2019 08:29   Dg Chest Port 1 View  Result Date:  05/16/2019 CLINICAL DATA:  RIGHT pneumothorax, chest tube EXAM: PORTABLE CHEST 1 VIEW COMPARISON:  Portable exam 0631 hours compared to 05/15/2019 FINDINGS: Pigtail RIGHT thoracostomy tube again seen. Normal heart size, mediastinal contours and pulmonary vascularity. Diffuse LEFT lung infiltrates consistent with pneumonia, slightly improved in LEFT upper lobe. Minimal RIGHT basilar atelectasis. No pleural effusion or residual pneumothorax. Bones unremarkable. IMPRESSION: RIGHT thoracostomy tube with mild RIGHT basilar atelectasis but no pneumothorax. Diffuse LEFT lung infiltrate consistent with pneumonia, slightly improved in LEFT upper lobe. Electronically Signed   By: Lavonia Dana M.D.   On:  05/16/2019 09:21      Resolved Hospital Problem list     Assessment & Plan:   Spontaneous right pneumothorax -CT to water seal today per CTS.  No air leak -Pleurodesis with talc prior to removing chest tube  Pulmonary infiltrates: Left greater than right  - [place] user > lives in 7+  Year old home -This would be an atypical presentation of  ILD: CT chest with diffuse GGO in left along with possible fibrotic change at base.  ESR: 100 with negative RVP, COVID, Urine  Strep negative. Urine legionella negative   - DDx - atypical infections, HIV, BOOP (focal), Hx,   Plan  -  Fill out ILD questionnaire  - await autoimmune sreology = check HIV - check urine tox (3 days - so can be false negative)  - Bronch  LEFT LUNG - BAL for cell count, RVP/COVID and culture and gram stain and TB (low tb risk though)    Risks of pneumothorax, hemothorax, sedation/anesthesia complications such as cardiac or respiratory arrest or hypotension, stroke and bleeding all explained. Benefits of diagnosis but limitations of non-diagnosis also explained. Patient verbalized understanding and wished to proceed. - Showed youtube of bronch  Also d/w Mr Castalia [place] with CVTS   > 50% of this > 40 min visit spent in face to face  counseling or/and coordination of care - by this undersigned MD - Dr Brand Males. This includes one or more of the following documented above: discussion of test results, diagnostic or treatment recommendations, prognosis, risks and benefits of management options, instructions, education, compliance or risk-factor reduction    SIGNATURE    Dr. Brand Males, M.D., F.C.C.P,  Pulmonary and Critical Care Medicine Staff Physician, [place] Director - Interstitial Lung Disease  Program  Pulmonary [place] [facility] [place], [place], [address]  Pager: [phone], If no answer or between  15:00h - 7:00h: call [phone] Telephone: [phone]  9:11 AM 05/17/2019

## 2019-05-17 NOTE — Discharge Instructions (Signed)
Pneumothorax °A pneumothorax is commonly called a collapsed lung. It is a condition in which air leaks from a lung and builds up between the thin layer of tissue that covers the lungs (visceral pleura) and the interior wall of the chest cavity (parietal pleura). The air gets trapped outside the lung, between the lung and the chest wall (pleural space). The air takes up space and prevents the lung from fully expanding. °This condition sometimes occurs suddenly with no apparent cause. The buildup of air may be small or large. A small pneumothorax may go away on its own. A large pneumothorax will require treatment and hospitalization. °What are the causes? °This condition may be caused by: °· Trauma and injury to the chest wall. °· Surgery and other medical procedures. °· A complication of an underlying lung problem, especially chronic obstructive pulmonary disease (COPD) or emphysema. °Sometimes the cause of this condition is not known. °What increases the risk? °You are more likely to develop this condition if: °· You have an underlying lung problem. °· You smoke. °· You are 20-40 years old, male, tall, and underweight. °· You have a personal or family history of pneumothorax. °· You have an eating disorder (anorexia nervosa). °This condition can also happen quickly, even in people with no history of lung problems. °What are the signs or symptoms? °Sometimes a pneumothorax will have no symptoms. When symptoms are present, they can include: °· Chest pain. °· Shortness of breath. °· Increased rate of breathing. °· Bluish color to your lips or skin (cyanosis). °How is this diagnosed? °This condition may be diagnosed by: °· A medical history and physical exam. °· A chest X-ray, chest CT scan, or ultrasound. °How is this treated? °Treatment depends on how severe your condition is. The goal of treatment is to remove the extra air and allow your lung to expand back to its normal size. °· For a small pneumothorax: °? No  treatment may be needed. °? Extra oxygen is sometimes used to make it go away more quickly. °· For a large pneumothorax or a pneumothorax that is causing symptoms, a procedure is done to drain the air from your lungs. To do this, a health care provider may use: °? A needle with a syringe. This is used to suck air from a pleural space where no additional leakage is taking place. °? A chest tube. This is used to suck air where there is ongoing leakage into the pleural space. The chest tube may need to remain in place for several days until the air leak has healed. °· In more severe cases, surgery may be needed to repair the damage that is causing the leak. °· If you have multiple pneumothorax episodes or have an air leak that will not heal, a procedure called a pleurodesis may be done. A medicine is placed in the pleural space to irritate the tissues around the lung so that the lung will stick to the chest wall, seal any leaks, and stop any buildup of air in that space. °If you have an underlying lung problem, severe symptoms, or a large pneumothorax you will usually need to stay in the hospital. °Follow these instructions at home: °Lifestyle °· Do not use any products that contain nicotine or tobacco, such as cigarettes and e-cigarettes. These are major risk factors in pneumothorax. If you need help quitting, ask your health care provider. °· Do not lift anything that is heavier than 10 lb (4.5 kg), or the limit that your health care   provider tells you, until he or she says that it is safe. °· Avoid activities that take a lot of effort (strenuous) for as long as told by your health care provider. °· Return to your normal activities as told by your health care provider. Ask your health care provider what activities are safe for you. °· Do not fly in an airplane or scuba dive until your health care provider says it is okay. °General instructions °· Take over-the-counter and prescription medicines only as told by your  health care provider. °· If a cough or pain makes it difficult for you to sleep at night, try sleeping in a semi-upright position in a recliner or by using 2 or 3 pillows. °· If you had a chest tube and it was removed, ask your health care provider when you can remove the bandage (dressing). While the dressing is in place, do not allow it to get wet. °· Keep all follow-up visits as told by your health care provider. This is important. °Contact a health care provider if: °· You cough up thick mucus (sputum) that is yellow or Jeffreys in color. °· You were treated with a chest tube, and you have redness, increasing pain, or discharge at the site where it was placed. °Get help right away if: °· You have increasing chest pain or shortness of breath. °· You have a cough that will not go away. °· You begin coughing up blood. °· You have pain that is getting worse or is not controlled with medicines. °· The site where your chest tube was located opens up. °· You feel air coming out of the site where the chest tube was placed. °· You have a fever or persistent symptoms for more than 2-3 days. °· You have a fever and your symptoms suddenly get worse. °These symptoms may represent a serious problem that is an emergency. Do not wait to see if the symptoms will go away. Get medical help right away. Call your local emergency services (911 in the [place] Do not drive yourself to the hospital. °Summary °· A pneumothorax, commonly called a collapsed lung, is a condition in which air leaks from a lung and gets trapped between the lung and the chest wall (pleural space). °· The buildup of air may be small or large. A small pneumothorax may go away on its own. A large pneumothorax will require treatment and hospitalization. °· Treatment for this condition depends on how severe the pneumothorax is. The goal of treatment is to remove the extra air and allow the lung to expand back to its normal size. °This information is not intended to  replace advice given to you by your health care provider. Make sure you discuss any questions you have with your health care provider. °Document Released: 08/08/2005 Document Revised: 07/21/2017 Document Reviewed: 07/17/2017 °Elsevier Patient Education © 2020 [employer] Inc. ° °

## 2019-05-17 NOTE — Discharge Summary (Addendum)
Physician Discharge Summary  Patient ID: Blake Benson MRN: [id number] DOB/AGE: 10/23/1970 48 y.o.  Admit date: 05/14/2019 Discharge date: 05/20/2019  Admission Diagnoses:  Discharge Diagnoses:  Active Problems:   Pneumothorax on right   Pneumothorax, right   ILD (interstitial lung disease) [facility])  Patient Active Problem List   Diagnosis Date Noted  . ILD (interstitial lung disease) ([place])   . Pneumothorax on right 05/14/2019  . Pneumothorax, right 05/14/2019   History of Present Illness:    at time of admission  Blake Benson is a 48 yo AA male transferred to [facility] emergency department for right pneumothorax.  The patient presented to [facility] ED with 3 day complaint of feeling ill with chest pain, shortness of breath, nausea, vomiting, diarrhea, and night sweats.  Workup in the ED was negative for COVID-19.  CXR was obtained and showed a right sided pneumothorax with mediastinal shift.  He was transferred to [facility] for intervention.  Upon arrival to the ED, the physician reviewed his chest xray and placed a right sided pigtail chest tube.  The patient tolerated the procedure without difficulty.  Currently the patient is sitting up in bed without complaints.  He states today was the first day he was able to eat any food.  He admits to smoking cigarettes which he states his usage has decreased, but he also smokes marijuana daily.  He drinks ETOH mainly on the weekends.  He has a previous history of pneumothorax requiring chest tube placement performed at [facility] in the past.  He has had 1 previous surgery on his left wrist and otherwise denies medical problems.    Discharged Condition: good  Hospital Course: The patient was admitted through the emergency department for further management of his pulmonary condition as well as chest tube.  A chest CT scan was obtained that showed some abnormalities, and due to this a pulmonology consultation was obtained for  further assistance with management.  He was placed on 2 antibiotics, Rocephin and Zithromax for infiltrates on the left side.  Additional testing was done including bronchoscopy and he will require close follow-up with him for test results and long-term management.  From the chest tube perspective the lungs remained expanded.  The chest tube was removed on the morning of 05/18/2019 and follow-up chest x-ray showed no evidence of pneumothorax or new findings.  He was otherwise clinically felt to be stable for discharge at that time.  Consults:pulmonology  Significant Diagnostic Studies: Chest CT scan  Treatments: Bronchoscopy and chest tube placement  Discharge Exam: Blood pressure 130/82, pulse 71, temperature 98.4 F (36.9 C), temperature source Oral, resp. rate 19, height 6\' 5"  (1.956 m), weight 73.9 kg, SpO2 100 %.  General appearance: alert, cooperative and no distress Heart: regular rate and rhythm Lungs: coarse in upper airway  Disposition: Discharge disposition: 01-Home or Self Care       Discharge Instructions    Discharge patient   Complete by: As directed    Discharge disposition: 01-Home or Self Care   Discharge patient date: 05/18/2019     Allergies as of 05/18/2019      Reactions   Tramadol    Sweating       Medication List    TAKE these medications   acetaminophen 325 MG tablet Commonly known as: TYLENOL Take 2 tablets (650 mg total) by mouth every 6 (six) hours as needed for mild pain or fever.   azithromycin 500 MG tablet Commonly known as:  Zithromax Take 1 tablet (500 mg total) by mouth daily.   montelukast 10 MG tablet Commonly known as: SINGULAIR Take 10 mg by mouth at bedtime.   oxyCODONE 5 MG immediate release tablet Commonly known as: Oxy IR/ROXICODONE Take 1 tablet (5 mg total) by mouth every 6 (six) hours as needed for up to 3 days for severe pain.      Follow-up Information    Kerin Perna, MD Follow up.   Specialty: Cardiothoracic  Surgery Why: Please see discharge paperwork for follow-up appointment with the surgeon.  Please also obtain a chest x-ray at [facility] imaging 1/2-hour prior to this appointment.  [facility] [facility] is in the same office complex. Contact information: [address] [place] [address] [phone]        Kalman Shan, MD Follow up.   Specialty: Pulmonary Disease Why: please contact office for follow-up appointment with the lung specialist to review labs and follow up care Contact information: [address] [place] [address] [phone]           Signed: [name] 05/20/2019, 2:08 PM   patient examined and medical record reviewed,agree with above note. Kathlee Nations Trigt III 05/21/2019

## 2019-05-17 NOTE — Progress Notes (Signed)
      [address]       [place] [address]             [phone]     Brief procedure:   Attempted to place 4 grams of talc in right chest tube. Gave 50 mg of 1 percent lidocaine and 4 mg of morphine but had a lot of pain . Decided to stop after 2 grams of the slurry given. O 2 sats remained good, 2 lites [place] placed for comfort.   John Giovanni, PA-C

## 2019-05-17 NOTE — Op Note (Signed)
Name:  Devonn Giampietro MRN:  [id number] DOB:  05-Aug-1971  PROCEDURE NOTE  Procedure(s): Bronchial alveolar lavage [phone]) of the - LINGULA   Indications:  - Diffuse parenchymal lung disease (Atypical ILD) - ground glass opacities - left lung diffuse  Consent:  Procedure, benefits, risks and alternatives discussed.  Questions answered.  Consent obtained.  Anesthesia:  Moderate Sedation  Location: Bronch lab - [facility]  Procedure summary:  Appropriate equipment was assembled.  The patient was brought to the procedure suite room and identified as Mayan Kloepfer with [birth year]  Safety timeout was performed. The patient was placed supine on the  table, airway and moderate sedation administered by this operator. Noted: he just had talc pleurodesis and was in pain . THis wsa helped by procedural fentanyl 80mg and versed 242m After the appropriate level of moderation was assured, flexible video bronchoscope was lubricated and inserted through the endotracheal tube.  Total of 18 mL of 1% Lidocaine were administered through the bronchoscope to augment moderate sedation  Airway examination was NOT performed bilaterally to subsegmental level but limited vocal cords, trachea, carina and lingular and RMB. Within this limited exam: .  Minimal clear secretions were noted, mucosa appeared normal and NO endobronchial lesions were identified.  Bronchial alveolar lavage of the 20cc first aliquot normal saline and then 60cc x 2 off normal saline Total return of 50 mL of fluid, after which the bronchoscope was withdrawn. Fluid was dark grey and abnormal looking  After hemostasis was assure, the bronchoscope was withdrawn.  The patient was recovered and then  transferred to recovery area  Post-procedure chest x-ray was yes ordered for 3pm.  Specimens sent: Bronchial alveolar lavage specimen of the LINGULA for cell count, microbiology and cytology and virology, AFB, Fungus, PCP  Complications:  No  immediate complications were noted.  Hemodynamic parameters and oxygenation remained stable throughout the procedure.  Estimated blood loss:  none  IMPRESSION 1. Limited airway exam - normal looking airway 2. BAL - Lingula - LINGULA for cell count, microbiology and cytology and virology 3. EnClabe Seallassifer sent - NO  Followup Future Appointments  Date Time Provider De[place]ol[place]10/02/2019  1:30 PM VaIvin PootMD [facility]  06/04/2019  2:45 PM RaBrand MalesMD [facility] None     Dr. MuBrand MalesM.D., F.[facility].P Pulmonary and Critical Care Medicine Staff Physician [place]ulmonary and Critical Care Pager: [phone] no answer or between  15:00h - 7:00h: call [phone]  05/17/2019 12:36 PM

## 2019-05-17 NOTE — Progress Notes (Signed)
Video Bronchoscopy  Intervention Bronchial washing 

## 2019-05-18 ENCOUNTER — Inpatient Hospital Stay (HOSPITAL_COMMUNITY): Payer: Managed Care, Other (non HMO)

## 2019-05-18 LAB — HIV ANTIBODY (ROUTINE TESTING W REFLEX): HIV Screen 4th Generation wRfx: NONREACTIVE

## 2019-05-18 LAB — NOVEL CORONAVIRUS, NAA (HOSP ORDER, SEND-OUT TO REF LAB; TAT 18-24 HRS): SARS-CoV-2, NAA: NOT DETECTED

## 2019-05-18 LAB — ACID FAST SMEAR (AFB, MYCOBACTERIA): Acid Fast Smear: NEGATIVE

## 2019-05-18 MED ORDER — ACETAMINOPHEN 325 MG PO TABS: 650.0000 mg | ORAL_TABLET | Freq: Four times a day (QID) | ORAL | Status: AC | PRN

## 2019-05-18 MED ORDER — AZITHROMYCIN 500 MG PO TABS: 500.0000 mg | ORAL_TABLET | Freq: Every day | ORAL | 0 refills | Status: AC

## 2019-05-18 MED ORDER — OXYCODONE HCL 5 MG PO TABS: 5.0000 mg | ORAL_TABLET | Freq: Four times a day (QID) | ORAL | 0 refills | Status: AC | PRN

## 2019-05-18 NOTE — Progress Notes (Signed)
   Remote review   SEriology - negative   Results for MONTERIUS, ROLF (MRN [id number]) as of 05/18/2019 11:39  Ref. Range 05/17/2019 12:22  Color, Fluid Latest Ref Range: YELLOW  COLORLESS (A)  Total Nucleated Cell Count, Fluid Latest Ref Range: 0 - 1,000 cu mm 655  Lymphs, Fluid Latest Units: % 25  Eos, Fluid Latest Units: % 4  Appearance, Fluid Latest Ref Range: CLEAR  HAZY (A)  Other Cells, Fluid Latest Units: % MESOTHELIAL CELLS  Neutrophil Count, Fluid Latest Ref Range: 0 - 25 % 40 (H)  Monocyte-Macrophage-Serous Fluid Latest Ref Range: 50 - 90 % 31 (L)    BAL shows mixed cellularity - this is not normal. 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 Brand Males, M.D., F.C.C.P,  Pulmonary and Critical Care Medicine Staff Physician, [place] Director - Interstitial Lung Disease  Program  Pulmonary [place], [place], [address]  Pager: [phone], If no answer or between  15:00h - 7:00h: call [phone] Telephone: [phone]  12:05 PM 05/18/2019

## 2019-05-18 NOTE — Progress Notes (Signed)
Per MD order chest tube removed by Linnell Fulling and Nigel Mormon. Pt educated regarding procedure and sutures cut, vasoline gauze and 4x4 gauze placed on chest and tube pulled after pt took deep breath and held. No problems noted and pt tolerated well. Radiology notified okay to get for x-ray. Will continue to monitor.

## 2019-05-18 NOTE — Progress Notes (Signed)
[address]       [place] [address]             [phone]      1 Day Post-Op Procedure(s) (LRB): VIDEO BRONCHOSCOPY WITHOUT FLUORO (Bilateral) Subjective: Feels much better  Objective: Vital signs in last 24 hours: Temp:  [98.1 F (36.7 C)-98.6 F (37 C)] 98.4 F (36.9 C) (09/26 0528) Pulse Rate:  [60-85] 60 (09/26 0528) Cardiac Rhythm: Normal sinus rhythm (09/26 0700) Resp:  [14-35] 25 (09/26 0528) BP: (121-148)/(71-97) 121/79 (09/26 0528) SpO2:  [96 %-100 %] 100 % (09/26 0528) FiO2 (%):  [3 %] 3 % (09/26 0528)  Hemodynamic parameters for last 24 hours:    Intake/Output from previous day: 09/25 0701 - 09/26 0700 In: 580 [P.O.:480; I.V.:100] Out: 1875 [Urine:1875] Intake/Output this shift: No intake/output data recorded.  General appearance: alert, cooperative and no distress Heart: regular rate and rhythm Lungs: coarse in upper airway  Lab Results: No results for input(s): WBC, HGB, HCT, PLT in the last 72 hours. BMET: No results for input(s): NA, K, CL, CO2, GLUCOSE, BUN, CREATININE, CALCIUM in the last 72 hours.  PT/INR: No results for input(s): LABPROT, INR in the last 72 hours. ABG No results found for: PHART, HCO3, TCO2, ACIDBASEDEF, O2SAT CBG (last 3)  No results for input(s): GLUCAP in the last 72 hours.  Meds Scheduled Meds: . enoxaparin (LOVENOX) injection  40 mg Subcutaneous Q24H  . montelukast  10 mg Oral QHS  . sodium chloride flush  3 mL Intravenous Q12H   Continuous Infusions: . sodium chloride Stopped (05/17/19 2232)  . azithromycin Stopped (05/17/19 1654)  . cefTRIAXone (ROCEPHIN)  IV Stopped (05/17/19 1545)   PRN Meds:.acetaminophen, docusate sodium, metoprolol tartrate, ondansetron **OR** ondansetron (ZOFRAN) IV, oxyCODONE  Xrays Ct Chest High Resolution  Result Date: 05/17/2019 CLINICAL DATA:  Evaluate for ILD EXAM: CT CHEST WITHOUT CONTRAST TECHNIQUE: Multidetector CT imaging of the chest was performed  following the standard protocol without intravenous contrast. High resolution imaging of the lungs, as well as inspiratory and expiratory imaging, was performed. COMPARISON:  05/15/2019, chest radiographs, 12/06/2016 FINDINGS: Cardiovascular: No significant vascular findings. Normal heart size. No pericardial effusion. Mediastinum/Nodes: No change in enlarged left paratracheal lymph node measuring 1.3 cm in short axis, better appreciated by prior contrast enhanced examination (series 10, image 57). Thyroid gland, trachea, and esophagus demonstrate no significant findings. Lungs/Pleura: Mild paraseptal emphysema. There is slight interval improvement in diffuse ground-glass opacity and interlobular septal thickening of the left lung, with minimal areas patchy ground-glass and consolidative opacity of the right lung, likewise slightly improved. There is no significant air trapping on expiratory phase imaging. Prone imaging suggests the presence of fibrotic scarring in the dependent portions of the left lung (series 9, image 46). A right-sided pigtail chest tube remains positioned in the major fissure. No significant residual pneumothorax. Benign 4 mm fissural nodule of the superior segment right lower lobe. Upper Abdomen: No acute abnormality. Musculoskeletal: No chest wall mass or suspicious bone lesions identified. IMPRESSION: 1. A right-sided pigtail chest tube remains positioned in the major fissure. No significant residual pneumothorax. 2. There is slight interval improvement in diffuse ground-glass opacity and interlobular septal thickening of the left lung, with minimal areas patchy ground-glass and consolidative opacity of the right lung, likewise slightly improved. Findings are most consistent with improved atypical infection. 3. Prone imaging suggests the presence of some degree of fibrotic scarring in the dependent portions of the left lung (e.g.  series 9, image 46), however there are no definitive findings  for fibrotic interstitial lung disease and this unilateral distribution favors sequelae of prior infection or inflammation. 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 Notably, these findings appear to be new when compared to centrally normal chest radiographs as recently as 12/06/2016. 4.  Mild underlying emphysema. 5. No change in enlarged left paratracheal lymph node measuring 1.3 cm in short axis, better appreciated by prior contrast enhanced examination (series 10, image 57). Electronically Signed   By: Eddie Candle M.D.   On: 05/17/2019 08:29   Dg Chest Port 1 View  Result Date: 05/17/2019 CLINICAL DATA:  S/P bronchoscopy (left) Patient denies any sob or chest pains. Has right side chest tube. Non smoker. No prior heart or lung surgeries. EXAM: PORTABLE CHEST 1 VIEW COMPARISON:  Chest radiograph 05/17/2019 at 6:31 a.m. FINDINGS: Stable cardiomediastinal contours. Right chest tube is in place. Right lung is clear. There are persistent hazy airspace opacities throughout the left lung. No pneumothorax. IMPRESSION: Stable exam with diffuse left lung infiltrates. Right chest tube in place. Electronically Signed   By: Audie Pinto M.D.   On: 05/17/2019 15:47   Dg Chest Port 1 View  Result Date: 05/17/2019 CLINICAL DATA:  Chest tube.  Follow-up exam. EXAM: PORTABLE CHEST 1 VIEW COMPARISON:  05/16/2019 and earlier exams. FINDINGS: Right-sided chest tube is stable.  No pneumothorax. Hazy left lung airspace opacity is unchanged. No new lung abnormalities. IMPRESSION: 1. No change from the previous day's exam. 2. Stable right chest tube.  No pneumothorax. 3. No change in left lung airspace opacities. Electronically Signed   By: Lajean Manes M.D.   On: 05/17/2019 09:22    Assessment/Plan:  S/P Procedure(s) (LRB): VIDEO BRONCHOSCOPY WITHOUT FLUORO (Bilateral)  1 hemodyn stable in sinus rhythm, on 3 liters- will wean. No air leak on H2O seal, moving air throughout. Will d/c tube this am and prob d/c later today if no new issues   LOS: 4 days    John Giovanni 05/18/2019

## 2019-05-19 ENCOUNTER — Encounter (HOSPITAL_COMMUNITY): Payer: Self-pay | Admitting: Internal Medicine

## 2019-05-19 LAB — MPO/PR-3 (ANCA) ANTIBODIES
ANCA Proteinase 3: <3.5 U/mL (ref 0.0–3.5)
Myeloperoxidase Abs: <9 U/mL (ref 0.0–9.0)

## 2019-05-20 LAB — CULTURE, BAL-QUANTITATIVE W GRAM STAIN
Culture: 4000 — AB
Special Requests: NORMAL

## 2019-05-21 LAB — RESPIRATORY VIRUS PANEL
Adenovirus: NEGATIVE
Influenza A: NEGATIVE
Influenza B: NEGATIVE
Metapneumovirus: NEGATIVE
Parainfluenza 1: NEGATIVE
Parainfluenza 2: NEGATIVE
Parainfluenza 3: NEGATIVE
Respiratory Syncytial Virus A: NEGATIVE
Respiratory Syncytial Virus B: NEGATIVE
Rhinovirus: NEGATIVE

## 2019-05-21 LAB — PNEUMOCYSTIS JIROVECI SMEAR BY DFA: Pneumocystis jiroveci Ag: NEGATIVE

## 2019-05-23 LAB — MTB RIF NAA NON-SPUTUM, W/O CULTURE

## 2019-05-28 ENCOUNTER — Other Ambulatory Visit: Payer: Self-pay | Admitting: Cardiothoracic Surgery

## 2019-05-28 DIAGNOSIS — J939 Pneumothorax, unspecified: Secondary | ICD-10-CM

## 2019-05-29 ENCOUNTER — Ambulatory Visit: Payer: Managed Care, Other (non HMO) | Admitting: Cardiothoracic Surgery

## 2019-06-04 ENCOUNTER — Inpatient Hospital Stay: Payer: BLUE CROSS/BLUE SHIELD | Admitting: Internal Medicine

## 2019-06-06 ENCOUNTER — Telehealth: Payer: Self-pay | Admitting: Internal Medicine

## 2019-06-06 NOTE — Telephone Encounter (Signed)
Blake Benson - no showed for his ILD evaluation.  Please call and facilitate a reappointment

## 2019-06-08 LAB — CULTURE, FUNGUS WITHOUT SMEAR: Special Requests: NORMAL

## 2019-06-11 NOTE — Telephone Encounter (Signed)
Called patient and spoke with him. Patient stated he was busy and at work and could not reschedule right now. Patient stated he would call back to reschedule.

## 2019-06-19 ENCOUNTER — Ambulatory Visit: Payer: Managed Care, Other (non HMO) | Admitting: Cardiothoracic Surgery

## 2019-06-26 ENCOUNTER — Ambulatory Visit: Payer: Managed Care, Other (non HMO) | Admitting: Cardiothoracic Surgery

## 2019-07-01 LAB — ACID FAST CULTURE WITH REFLEXED SENSITIVITIES (MYCOBACTERIA): Acid Fast Culture: NEGATIVE

## 2019-07-01 NOTE — Telephone Encounter (Signed)
Called and spoke with pt asking if he was ready to reschedule his appt. Pt said he would have to call us back when he was ready to schedule the appt as his insurance changed and he was waiting for the new insurance to begin.  Routing to MR as an FYI that at this current time, pt is not able to reschedule appt.

## 2020-01-15 IMAGING — CT CT CHEST W/ CM
2 of 3 series · 15 of 36 positions shown, 18 images · IV contrast (omnipaque)
Comparison: Chest radiograph 05/14/2019

CLINICAL DATA: Spontaneous pneumothorax. Status post chest tube
placement.

EXAM:
CT CHEST WITH CONTRAST
TECHNIQUE: Multidetector CT imaging of the chest was performed during
intravenous contrast administration.
CONTRAST:  <See Chart> OMNIPAQUE IOHEXOL 300 MG/ML  SOLN

[Series 3: thorax 2.0 i31f 2 · axial · 0.79mm/px · z∈[+158,+410]mm · 12 of 150 slices shown, 15 images]
[im 12/150  mediastinal]
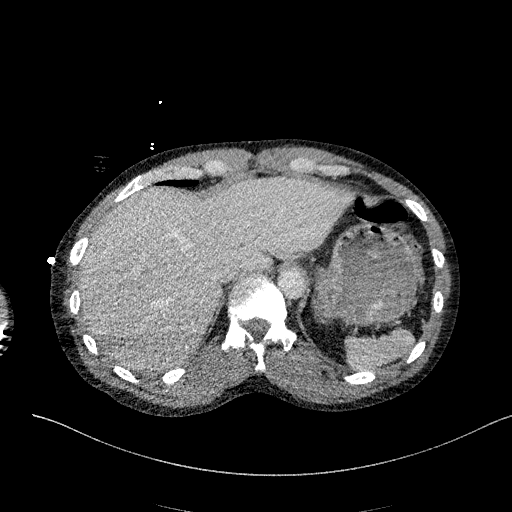
[im 12/150  lung]
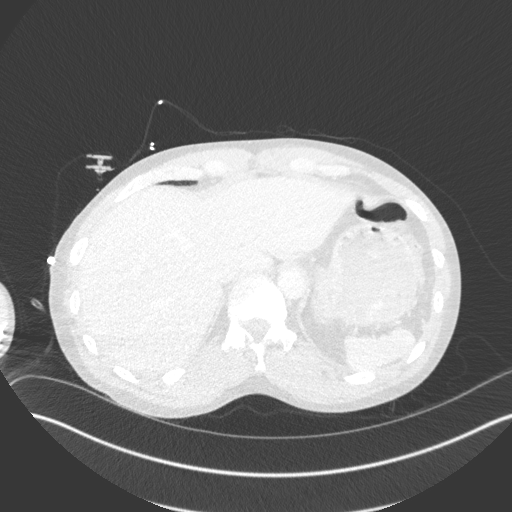
[im 23/150  lung]
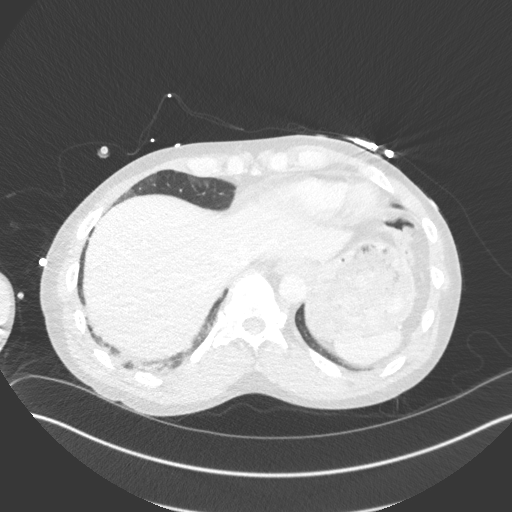
[im 34/150  lung]
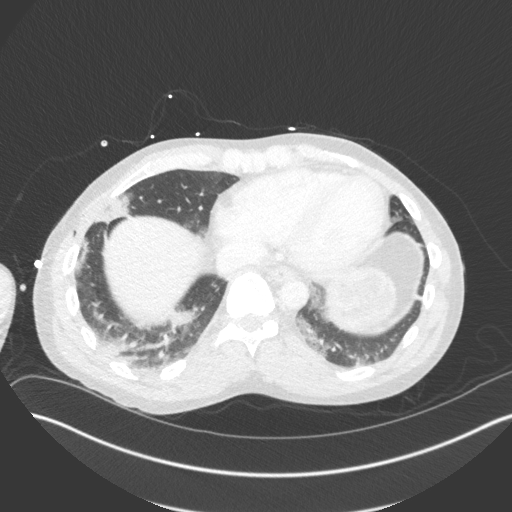
[im 45/150  lung]
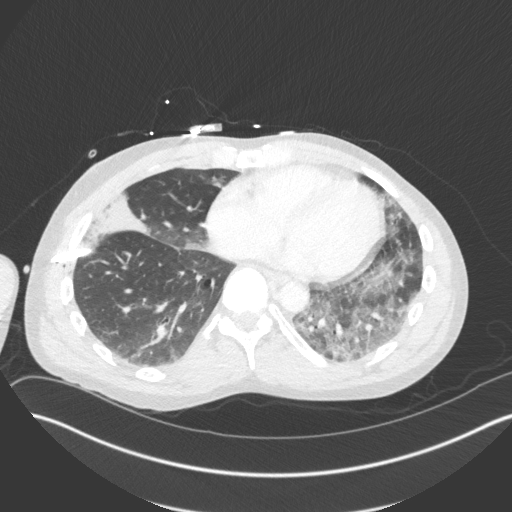
[im 56/150  mediastinal]
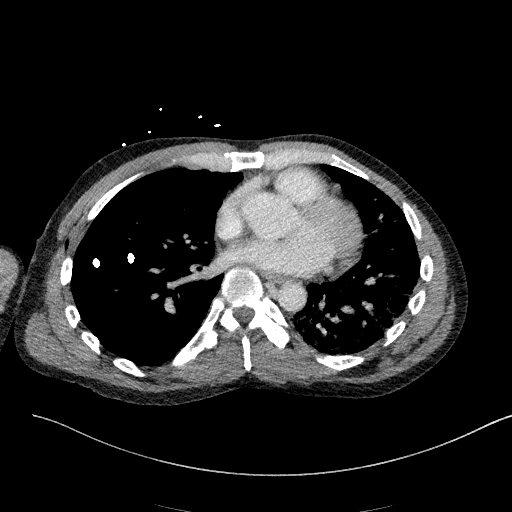
[im 56/150  lung]
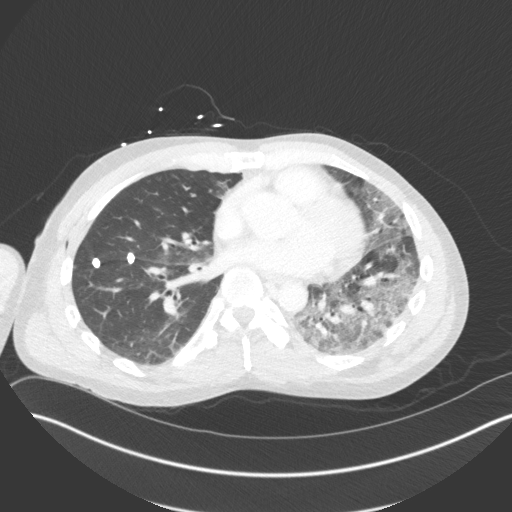
[im 67/150  lung]
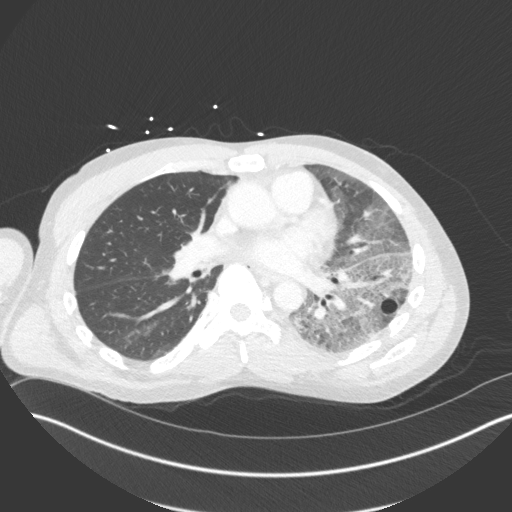
[im 83/150  lung]
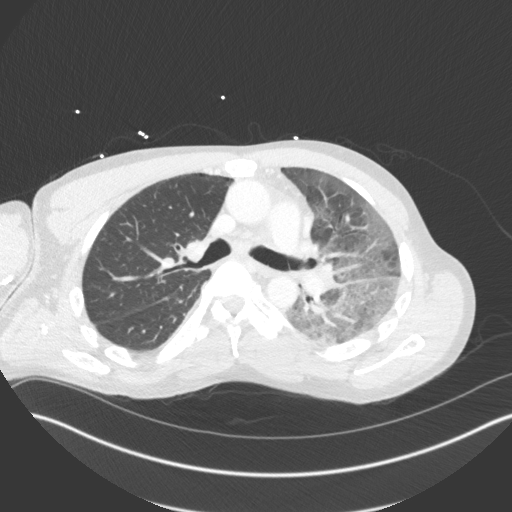
[im 94/150  lung]
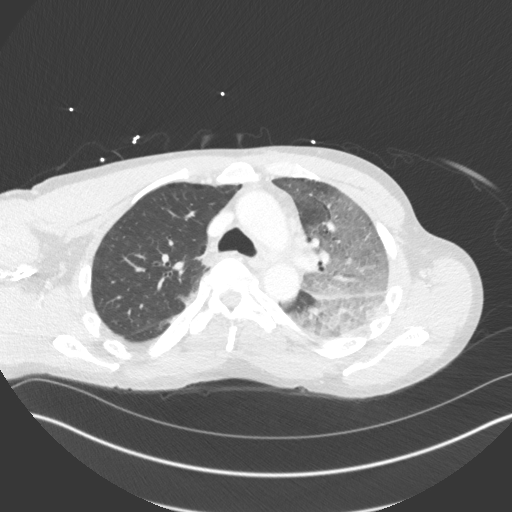
[im 105/150  mediastinal]
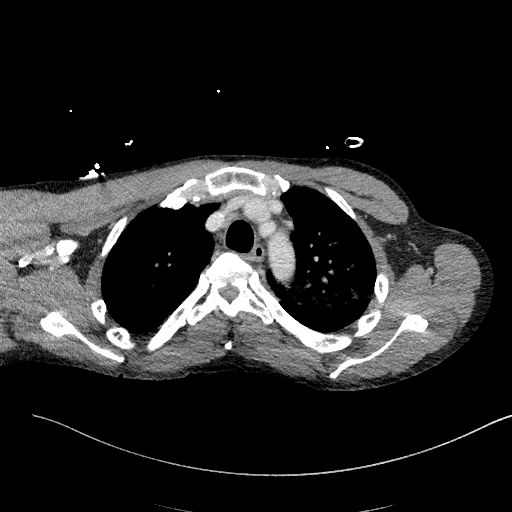
[im 105/150  lung]
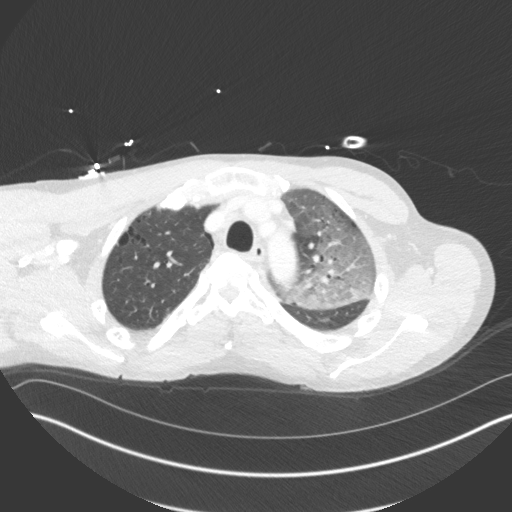
[im 116/150  lung]
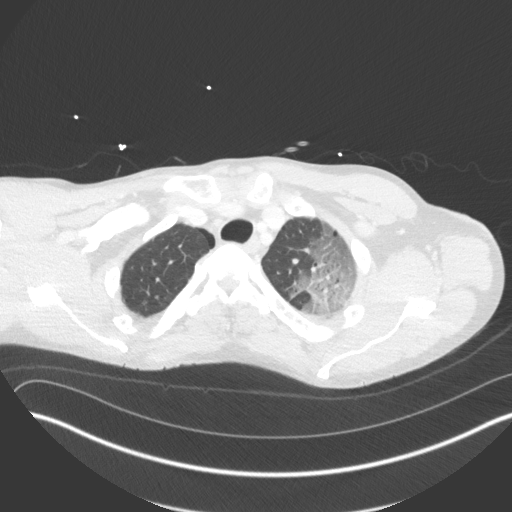
[im 127/150  lung]
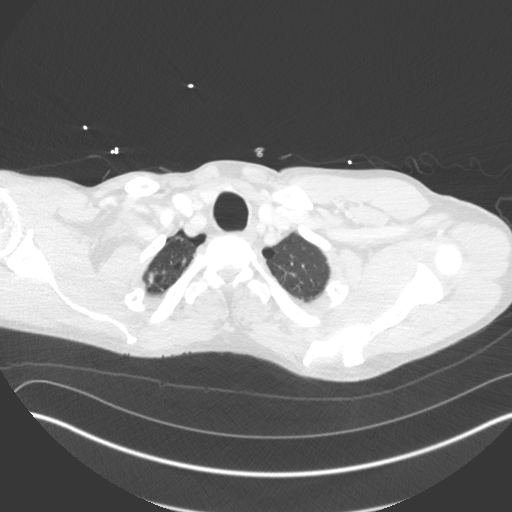
[im 138/150  lung]
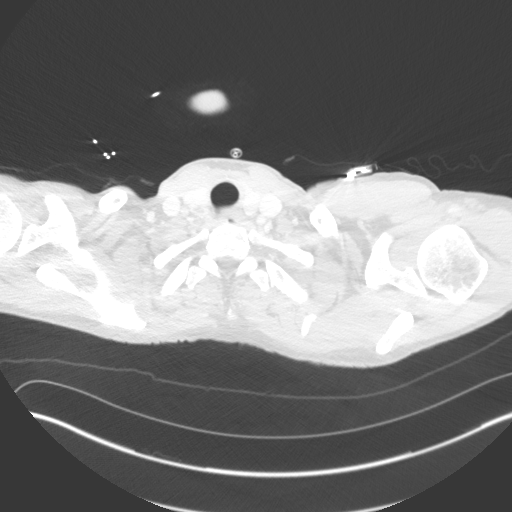

[Series 6: coronal · coronal · 0.55mm/px · 3 of 151 slices shown]
[im 31/151  lung]
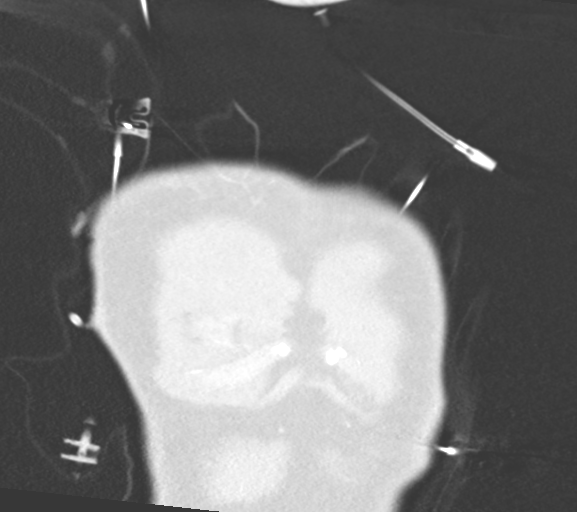
[im 61/151  lung]
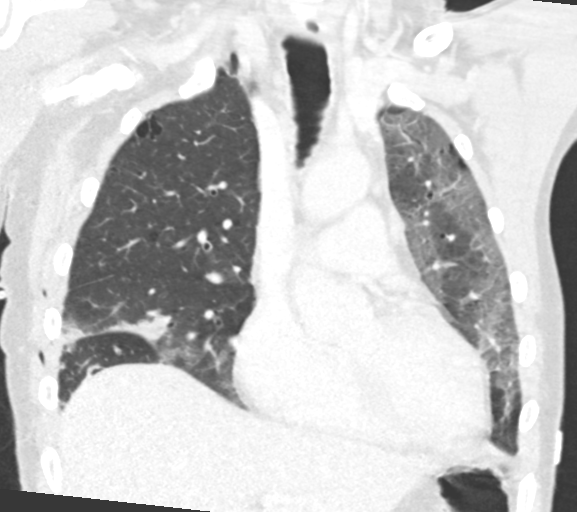
[im 91/151  lung]
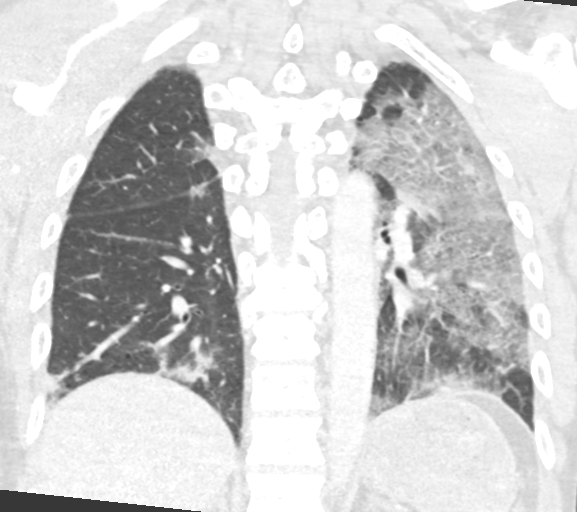

[15 of 36 positions shown; findings below may reference images not displayed]

FINDINGS: Cardiovascular: No significant vascular findings. Normal heart size.
No pericardial effusion.

Mediastinum/Nodes: Normal appearance of the thyroid gland. The
trachea appears patent and is midline. Normal appearance of the
esophagus. 1.5 cm left paratracheal lymph node identified, image
59/3. The right hilar lymph node measures 1.2 cm, image 75/3.

Lungs/Pleura: No pleural effusion. Paraseptal emphysema. Right-sided
percutaneous chest tube is identified with pigtail within the minor
fissure. Near complete resolution of previous right pneumothorax.
Small residual scratch set tiny residual pneumothorax in the
anteromedial right apex. There is diffuse ground-glass attenuation
throughout the left lung with diffuse interstitial thickening.
Multifocal patchy areas of ground-glass attenuation and airspace
consolidation noted within the right lung. The largest area is in
the posterolateral right middle lobe, image 106/5.

Upper Abdomen: No acute abnormality.

Musculoskeletal: No chest wall abnormality. No acute or significant
osseous findings.
IMPRESSION: 1. Right-sided chest tube remains in place. Near complete resolution
of right pneumothorax with tiny residual component in the
anteromedial right apex.
2. Multifocal areas of ground-glass attenuation and airspace
consolidation involving the right upper lobe, right middle lobe and
right lower lobe. Findings may be the sequelae of recent large
pneumothorax. Multifocal infection not excluded.
3. Diffuse ground-glass attenuation and interstitial thickening is
noted throughout the left lung. This may represent asymmetric edema
versus diffuse atypical infection
4. Enlarged left paratracheal lymph node is identified. Nonspecific.
The setting of infection this is favored to represent reactive
adenopathy. Follow-up imaging in 3 months with repeat CT of the
chest is advised to ensure resolution. This recommendation follows
ACR consensus guidelines: Managing Incidental Findings on Thoracic
CT: Mediastinal and Cardiovascular Findings. A White Paper of the
ACR Incidental Findings Committee. [HOSPITAL]. 8134; 15:
5.  Aortic Atherosclerosis ([id number]).

## 2020-01-16 IMAGING — DX DG CHEST 1V PORT
1 series · 1 of 1 positions shown · non-contrast
Comparison: Portable exam 2362 hours compared to 05/15/2019

CLINICAL DATA: RIGHT pneumothorax, chest tube

EXAM:
PORTABLE CHEST 1 VIEW

[chest]
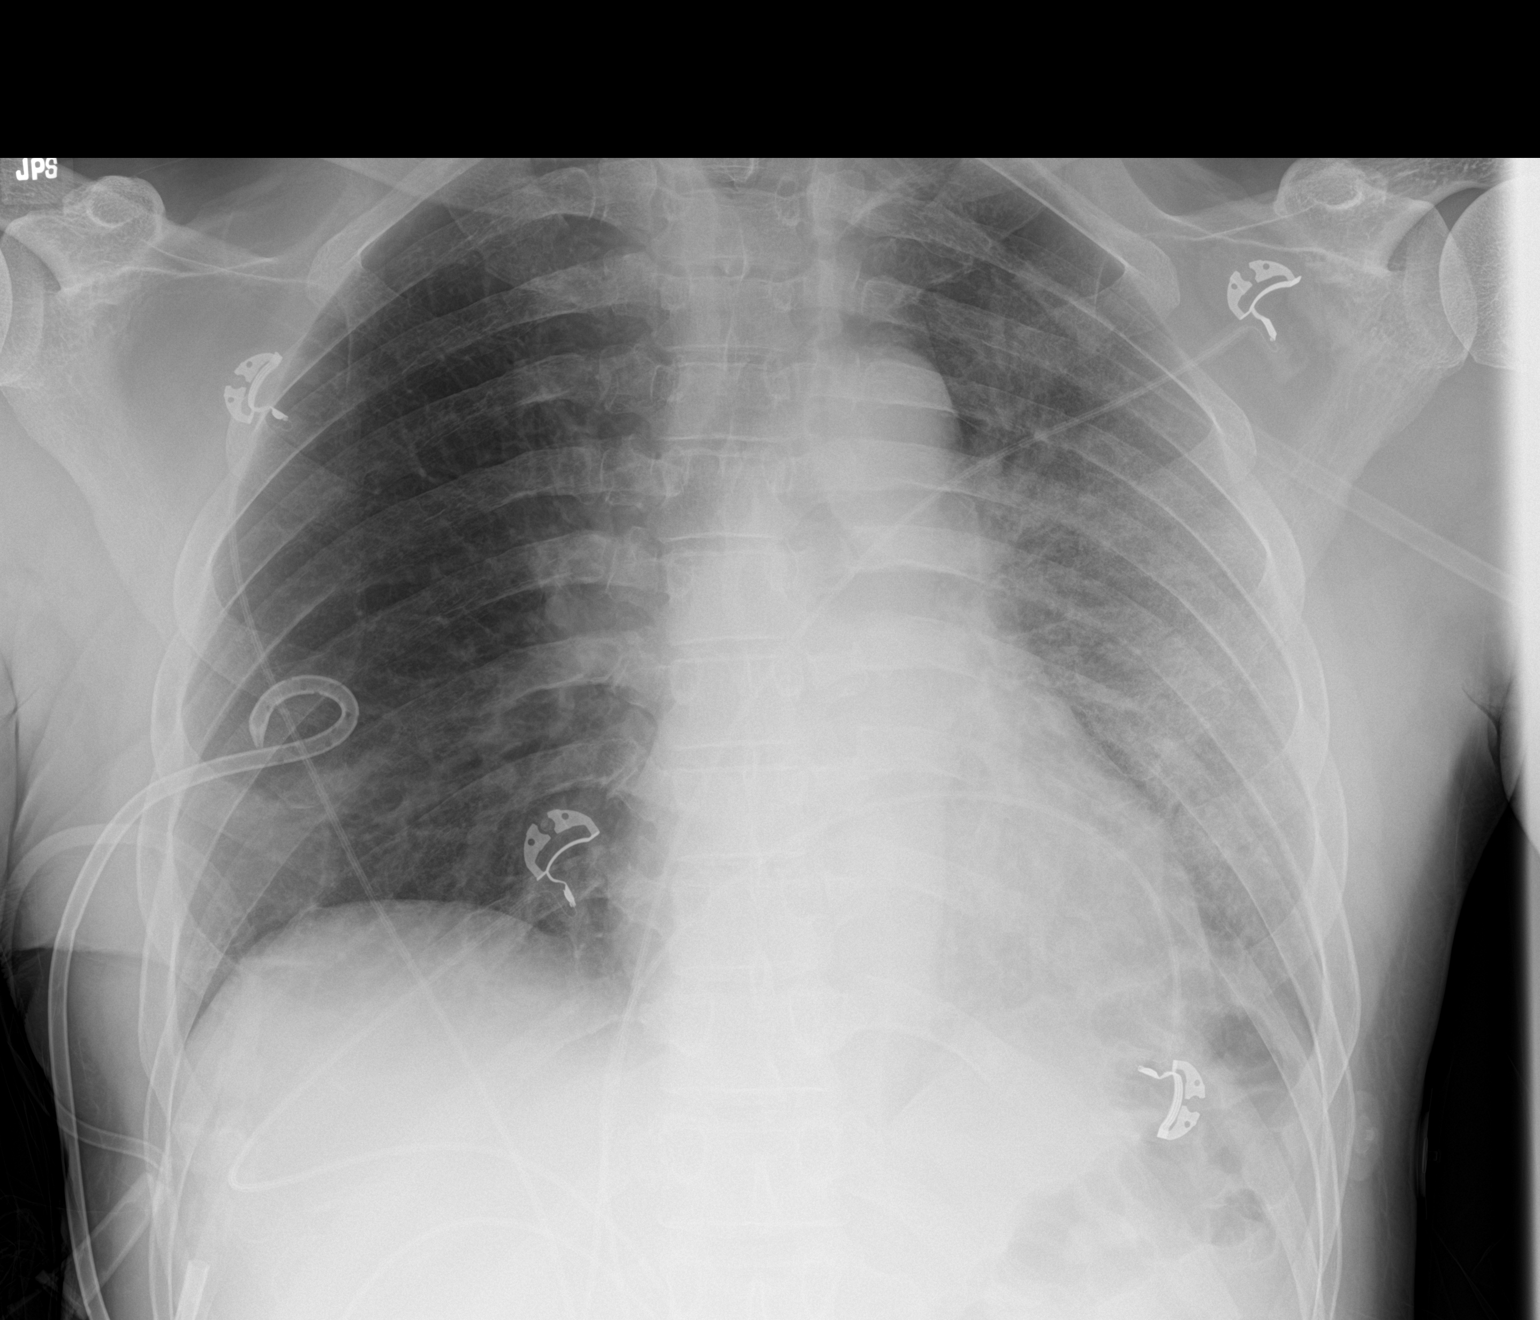

[1 of 1 positions shown; findings below may reference images not displayed]

FINDINGS: Pigtail RIGHT thoracostomy tube again seen.

Normal heart size, mediastinal contours and pulmonary vascularity.

Diffuse LEFT lung infiltrates consistent with pneumonia, slightly
improved in LEFT upper lobe.

Minimal RIGHT basilar atelectasis.

No pleural effusion or residual pneumothorax.

Bones unremarkable.
IMPRESSION: RIGHT thoracostomy tube with mild RIGHT basilar atelectasis but no
pneumothorax.

Diffuse LEFT lung infiltrate consistent with pneumonia, slightly
improved in LEFT upper lobe.

## 2020-01-18 IMAGING — DX DG CHEST 1V PORT
1 series · 1 of 1 positions shown · non-contrast
Comparison: 05/17/2019

CLINICAL DATA: Status post chest tube removal

EXAM:
PORTABLE CHEST 1 VIEW

[chest ap]
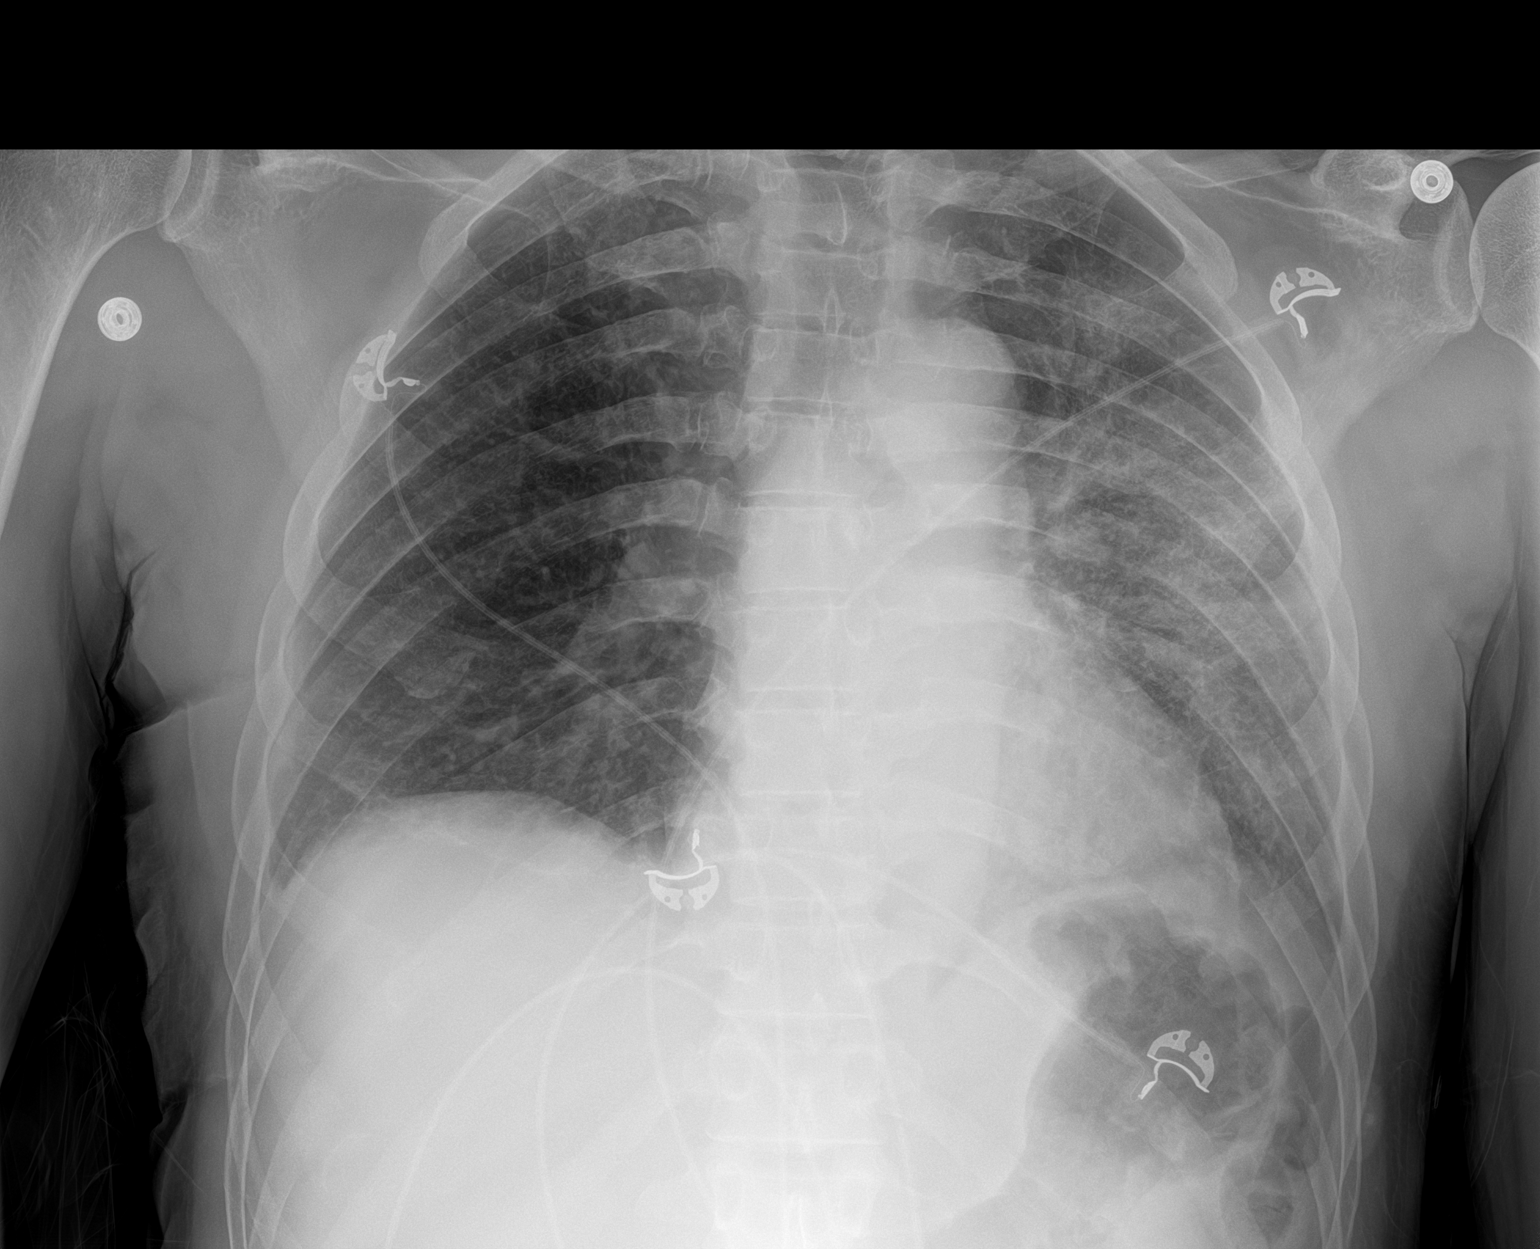

[1 of 1 positions shown; findings below may reference images not displayed]

FINDINGS: There has been interval removal of a right-sided pigtail chest tube.
There is no significant pneumothorax. The right lung is normally
aerated. There is redemonstrated extensive heterogeneous and
interstitial opacity of the left lung. The heart and mediastinum are
unremarkable.
IMPRESSION: 1. There has been interval removal of a right-sided pigtail chest
tube. There is no significant pneumothorax. The right lung is
normally aerated.

2. There is redemonstrated extensive heterogeneous and interstitial
opacity of the left lung.
